# Patient Record
Sex: Female | Born: 1970
Health system: Southern US, Community
[De-identification: ages and names within clinical notes are randomized; demographics above are authoritative.]

## PROBLEM LIST (undated history)

## (undated) DIAGNOSIS — E785 Hyperlipidemia, unspecified: Secondary | ICD-10-CM

## (undated) DIAGNOSIS — G4700 Insomnia, unspecified: Secondary | ICD-10-CM

## (undated) DIAGNOSIS — T753XXA Motion sickness, initial encounter: Secondary | ICD-10-CM

## (undated) DIAGNOSIS — Z8619 Personal history of other infectious and parasitic diseases: Secondary | ICD-10-CM

## (undated) DIAGNOSIS — M1990 Unspecified osteoarthritis, unspecified site: Secondary | ICD-10-CM

## (undated) DIAGNOSIS — F419 Anxiety disorder, unspecified: Secondary | ICD-10-CM

## (undated) DIAGNOSIS — K219 Gastro-esophageal reflux disease without esophagitis: Secondary | ICD-10-CM

## (undated) HISTORY — DX: Insomnia, unspecified: G47.00

## (undated) HISTORY — DX: Hyperlipidemia, unspecified: E78.5

## (undated) HISTORY — DX: Anxiety disorder, unspecified: F41.9

## (undated) HISTORY — DX: Personal history of other infectious and parasitic diseases: Z86.19

---

## 1991-09-11 HISTORY — PX: TUBAL LIGATION: SHX77

## 2011-05-31 LAB — HM PAP SMEAR: HM Pap smear: NEGATIVE

## 2015-04-01 ENCOUNTER — Ambulatory Visit: Payer: Self-pay | Admitting: Family Medicine

## 2015-04-06 ENCOUNTER — Ambulatory Visit: Payer: Self-pay | Admitting: Family Medicine

## 2015-04-07 DIAGNOSIS — G4700 Insomnia, unspecified: Secondary | ICD-10-CM | POA: Insufficient documentation

## 2015-04-07 DIAGNOSIS — F419 Anxiety disorder, unspecified: Secondary | ICD-10-CM | POA: Insufficient documentation

## 2015-04-07 DIAGNOSIS — E7800 Pure hypercholesterolemia, unspecified: Secondary | ICD-10-CM | POA: Insufficient documentation

## 2015-04-08 ENCOUNTER — Ambulatory Visit (INDEPENDENT_AMBULATORY_CARE_PROVIDER_SITE_OTHER): Payer: Commercial Managed Care - HMO | Admitting: Family Medicine

## 2015-04-08 ENCOUNTER — Encounter: Payer: Self-pay | Admitting: Family Medicine

## 2015-04-08 VITALS — BP 110/70 | HR 80 | Temp 98.4°F | Resp 16 | Ht 62.75 in | Wt 151.0 lb

## 2015-04-08 DIAGNOSIS — R351 Nocturia: Secondary | ICD-10-CM | POA: Diagnosis not present

## 2015-04-08 DIAGNOSIS — Z0000 Encounter for general adult medical examination without abnormal findings: Secondary | ICD-10-CM

## 2015-04-08 LAB — POCT URINALYSIS DIPSTICK
BILIRUBIN UA: NEGATIVE
Glucose, UA: NEGATIVE
Ketones, UA: NEGATIVE
LEUKOCYTES UA: NEGATIVE
Nitrite, UA: NEGATIVE
PROTEIN UA: NEGATIVE
Spec Grav, UA: 1.01
Urobilinogen, UA: 0.2
pH, UA: 7

## 2015-04-08 NOTE — Progress Notes (Signed)
Patient: Suzanne Sanchez, Female    DOB: [birth year], 44 y.o.   MRN: [id number] Visit Date: 04/08/2015  Today's Provider: Lelon Huh, MD   Chief Complaint  Patient presents with  . Establish Care  . Annual Exam  . Hyperlipidemia   Subjective:    Annual physical exam Suzanne Sanchez is a 44 y.o. female who presents today for health maintenance and complete physical. She feels fairly well. She reports exercising  wakling 1 hour four times a week. She reports she is sleeping fairly well.  -----------------------------------------------------------------  Lipid/Cholesterol, Follow-up:    Risk factors for vascular disease include none  She reports good compliance with treatment. She is not having side effects.  Current symptoms include polyuria and have been stable. Weight trend: stable Prior visit with dietician: no Current diet: in general, a "healthy" diet   Current exercise: walking Current treatment includes Atorvatstain 20mg  daily.  Wt Readings from Last 3 Encounters:  04/08/15 151 lb (68.493 kg)  05/31/11 146 lb (66.225 kg)    -------------------------------------------------------------------   Review of Systems  Endocrine: Positive for polyuria.  Musculoskeletal: Positive for back pain and arthralgias.  All other systems reviewed and are negative.   Social History She  reports that she has never smoked. She does not have any smokeless tobacco history on file. She reports that she does not drink alcohol or use illicit drugs.  Patient Active Problem List   Diagnosis Date Noted  . Insomnia 04/07/2015  . Pure hypercholesterolemia 04/07/2015  . Anxiety disorder 04/07/2015    Past Surgical History  Procedure Laterality Date  . Tubal ligation  1993    Family History Her family history is not on file. She was adopted.    No Known Allergies  Previous Medications   ATORVASTATIN (LIPITOR) 20 MG TABLET    Take 20 mg by mouth daily.     Patient Care Team: Birdie Sons, MD as PCP - General (Family Medicine)     Objective:   Vitals: BP 110/70 mmHg  Pulse 80  Temp(Src) 98.4 F (36.9 C) (Oral)  Resp 16  Ht 5' 2.75" (1.594 m)  Wt 151 lb (68.493 kg)  BMI 26.96 kg/m2  SpO2 98%   Physical Exam   General Appearance:    Alert, cooperative, no distress, appears stated age  Head:    Normocephalic, without obvious abnormality, atraumatic  Eyes:    PERRL, conjunctiva/corneas clear, EOM's intact, fundi    benign, both eyes  Ears:    Normal TM's and external ear canals, both ears  Nose:   Nares normal, septum midline, mucosa normal, no drainage    or sinus tenderness  Throat:   Lips, mucosa, and tongue normal; teeth and gums normal  Neck:   Supple, symmetrical, trachea midline, no adenopathy;    thyroid:  no enlargement/tenderness/nodules; no carotid   bruit or JVD  Back:     Symmetric, no curvature, ROM normal, no CVA tenderness  Lungs:     Clear to auscultation bilaterally, respirations unlabored  Chest Wall:    No tenderness or deformity   Heart:    Regular rate and rhythm, S1 and S2 normal, no murmur, rub   or gallop  Breast Exam:    deferred  Abdomen:     Benign  Pevic:    deferred  Extremities:   Extremities normal, atraumatic, no cyanosis or edema  Pulses:   2+ and symmetric all extremities  Skin:   Skin color, texture, turgor normal,  no rashes or lesions  Lymph nodes:   No apparent LAD  Neurologic:   No gross deficits.     Depression Screen PHQ 2/9 Scores 04/08/2015  PHQ - 2 Score 0  PHQ- 9 Score 1      Assessment & Plan:     Routine Health Maintenance and Physical Exam  Exercise Activities and Dietary recommendations Goals    None      Immunization History  Administered Date(s) Administered  . Tdap 09/26/2006    Health Maintenance  Topic Date Due  . HIV Screening  07/19/1985  . PAP SMEAR  05/30/2014  . INFLUENZA VACCINE  04/11/2015  . TETANUS/TDAP  09/26/2016      1.  Annual physical exam  - Lipid panel - TSH - Comprehensive metabolic panel - She prefers to see Gyn for routine pap/pelvi/breast exam and requests referral today   2. Nocturia Likely fluid redistribution. Try to get most of fluid intake earlier in day.  - POCT urinalysis dipstick    --------------------------------------------------------------------

## 2015-04-09 LAB — COMPREHENSIVE METABOLIC PANEL
A/G RATIO: 1.8 (ref 1.1–2.5)
ALT: 14 IU/L (ref 0–32)
AST: 17 IU/L (ref 0–40)
Albumin: 4.2 g/dL (ref 3.5–5.5)
Alkaline Phosphatase: 63 IU/L (ref 39–117)
BUN/Creatinine Ratio: 8 — ABNORMAL LOW (ref 9–23)
BUN: 6 mg/dL (ref 6–24)
Bilirubin Total: 0.5 mg/dL (ref 0.0–1.2)
CALCIUM: 9.4 mg/dL (ref 8.7–10.2)
CO2: 25 mmol/L (ref 18–29)
Chloride: 101 mmol/L (ref 97–108)
Creatinine, Ser: 0.74 mg/dL (ref 0.57–1.00)
GFR calc Af Amer: 114 mL/min/{1.73_m2} (ref >59)
GFR, EST NON AFRICAN AMERICAN: 99 mL/min/{1.73_m2} (ref >59)
GLOBULIN, TOTAL: 2.3 g/dL (ref 1.5–4.5)
GLUCOSE: 96 mg/dL (ref 65–99)
Potassium: 4.2 mmol/L (ref 3.5–5.2)
Sodium: 140 mmol/L (ref 134–144)
TOTAL PROTEIN: 6.5 g/dL (ref 6.0–8.5)

## 2015-04-09 LAB — LIPID PANEL
CHOL/HDL RATIO: 2.3 ratio (ref 0.0–4.4)
Cholesterol, Total: 126 mg/dL (ref 100–199)
HDL: 56 mg/dL (ref >39)
LDL CALC: 54 mg/dL (ref 0–99)
Triglycerides: 80 mg/dL (ref 0–149)
VLDL Cholesterol Cal: 16 mg/dL (ref 5–40)

## 2015-04-09 LAB — TSH: TSH: 0.948 u[IU]/mL (ref 0.450–4.500)

## 2015-04-19 ENCOUNTER — Telehealth: Payer: Self-pay | Admitting: Family Medicine

## 2015-04-19 NOTE — Telephone Encounter (Signed)
atorvastatin (LIPITOR) 20 MG tablet  [facility] Suzanne Sanchez

## 2015-05-03 ENCOUNTER — Telehealth: Payer: Self-pay | Admitting: Family Medicine

## 2015-05-03 NOTE — Telephone Encounter (Signed)
[facility] called to tell us that Ms. Trinkle no showed for her appt. today, 05/03/15

## 2015-06-27 ENCOUNTER — Other Ambulatory Visit: Payer: Self-pay | Admitting: Family Medicine

## 2015-06-27 MED ORDER — ATORVASTATIN CALCIUM 20 MG PO TABS: 20.0000 mg | ORAL_TABLET | Freq: Every day | ORAL | Status: DC

## 2015-06-27 NOTE — Telephone Encounter (Signed)
Pt contacted office for refill request on the following medications:  atorvastatin (LIPITOR) 20 MG.  [facility].  [phone]

## 2016-02-21 ENCOUNTER — Ambulatory Visit (INDEPENDENT_AMBULATORY_CARE_PROVIDER_SITE_OTHER): Payer: Commercial Managed Care - HMO | Admitting: Family Medicine

## 2016-02-21 ENCOUNTER — Encounter: Payer: Self-pay | Admitting: Family Medicine

## 2016-02-21 VITALS — BP 100/60 | HR 78 | Temp 98.4°F | Resp 16 | Ht 62.75 in | Wt 150.0 lb

## 2016-02-21 DIAGNOSIS — I868 Varicose veins of other specified sites: Secondary | ICD-10-CM

## 2016-02-21 DIAGNOSIS — I8390 Asymptomatic varicose veins of unspecified lower extremity: Secondary | ICD-10-CM

## 2016-02-21 DIAGNOSIS — L723 Sebaceous cyst: Secondary | ICD-10-CM | POA: Diagnosis not present

## 2016-02-21 NOTE — Progress Notes (Signed)
       Patient: Suzanne Sanchez Female    DOB: [birth year]   45 y.o.   MRN: [id number] Visit Date: 02/21/2016  Today's Provider: Lelon Huh, MD   Chief Complaint  Patient presents with  . Cyst   Subjective:    HPI  Patient has had cyst on upper back left shoulder area for a few years. Started as a blackhead and has grown into a lump which occasionally drains purulent material. It is not sore or painful now, but is bothersome and she would like to have it removed.   Patient also has a swollen vein on her left knee that has grown in the last month.   No Known Allergies No outpatient prescriptions have been marked as taking for the 02/21/16 encounter (Office Visit) with Birdie Sons, MD.    Review of Systems  Constitutional: Negative for fever, chills, appetite change and fatigue.  Respiratory: Negative for chest tightness and shortness of breath.   Cardiovascular: Negative for chest pain and palpitations.  Gastrointestinal: Negative for nausea, vomiting and abdominal pain.  Neurological: Negative for dizziness and weakness.    Social History  Substance Use Topics  . Smoking status: Never Smoker   . Smokeless tobacco: Not on file  . Alcohol Use: No   Objective:   BP 100/60 mmHg  Pulse 78  Temp(Src) 98.4 F (36.9 C) (Oral)  Resp 16  Ht 5' 2.75" (1.594 m)  Wt 150 lb (68.04 kg)  BMI 26.78 kg/m2  SpO2 98%  Physical Exam  General appearance: alert, well developed, well nourished, cooperative and in no distress Head: Normocephalic, without obvious abnormality, atraumatic Respiratory: Respirations even and unlabored, normal respiratory rate Extremities: No gross deformities Skin: About 4 cm non-tender sebaceous cyst upper back. No discharge. Small mass ante-lateral aspect of left knee with BB sized mass, appears to overlie varicose vein.      Assessment & Plan:      1. Sebaceous cyst back Counseled on benign character of lesion, but she is very bothered by it  and wants it excised.  - Ambulatory referral to General Surgery  2. Varicose vein knee (suspected) - Have surgery take a look at it, consider vascular referral       Lelon Huh, MD  [place]

## 2016-02-28 ENCOUNTER — Encounter: Payer: Self-pay | Admitting: *Deleted

## 2016-03-05 ENCOUNTER — Encounter: Payer: Self-pay | Admitting: General Surgery

## 2016-03-05 ENCOUNTER — Ambulatory Visit (INDEPENDENT_AMBULATORY_CARE_PROVIDER_SITE_OTHER): Payer: 59 | Admitting: General Surgery

## 2016-03-05 VITALS — BP 106/58 | HR 68 | Resp 12 | Ht 62.0 in | Wt 152.0 lb

## 2016-03-05 DIAGNOSIS — L723 Sebaceous cyst: Secondary | ICD-10-CM | POA: Diagnosis not present

## 2016-03-05 NOTE — Progress Notes (Signed)
Patient ID: Suzanne Sanchez, female   DOB: 1971/06/19, 45 y.o.   MRN: [id number]  Chief Complaint  Patient presents with  . Cyst    HPI Suzanne Sanchez is a 45 y.o. female.  Here today for evaluation of an upper back cyst. She states she had it drained by her husband back in December 2016. She states it started out as a black head. She states the area started swelling again last month. Denies any pain. 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  She is her today with her daughter, Suzanne Sanchez.  I personally reviewed the patient's history.   HPI  Past Medical History  Diagnosis Date  . History of chicken pox   . Insomnia   . Hyperlipidemia   . Anxiety     Past Surgical History  Procedure Laterality Date  . Tubal ligation  1993    Family History  Problem Relation Age of Onset  . Adopted: Yes    Social History Social History  Substance Use Topics  . Smoking status: Never Smoker   . Smokeless tobacco: Never Used  . Alcohol Use: No    No Known Allergies  No current outpatient prescriptions on file.   No current facility-administered medications for this visit.    Review of Systems Review of Systems  Constitutional: Negative.   Respiratory: Negative.   Cardiovascular: Negative.     Blood pressure 106/58, pulse 68, resp. rate 12, height 5\' 2"  (1.575 m), weight 152 lb (68.947 kg), last menstrual period 03/04/2016.  Physical Exam Physical Exam  Constitutional: She is oriented to person, place, and time. She appears well-developed and well-nourished.  Neck:    Neurological: She is alert and oriented to person, place, and time.  Skin: Skin is warm and dry.  Psychiatric: Her behavior is normal.       Assessment    Symptomatic sebaceous cyst, presently noninfected.    Plan    Indication for surgical excision were reviewed. The patient was amenable to proceed. The area was cleansed with ChloraPrep and 20 mL of 0.5% Xylocaine with 0.25%  Marcaine with 1 200,000 units epinephrine was utilized well tolerated. The area was reprepped with ChloraPrep. Through elliptical incision the intact cyst sac was excised. This was sent for routine histology. The wound was approximated with 3-0 Vicryl sutures to the adipose layer and interrupted 4-0 Prolene sutures to the skin. Telfa and Tegaderm dressing applied. Ice pack provided. Wound care reviewed.  The patient's daughter is a Marine scientist. She may remove the sutures and 9-10 days if desired or the patient may return here for formal assessment. If her daughter removes the sutures she has been asked to call give a description of the wound.    PCP:  Lelon Huh This information has been scribed by Karie Fetch RN, BSN,BC.    Robert Bellow 03/06/2016, 4:29 PM

## 2016-03-05 NOTE — Patient Instructions (Addendum)
The patient is aware to call back for any questions or concerns. keep area clean May shower with dressing in place May remove dressing in 2-3 days and use band aid to cover the area

## 2016-03-06 DIAGNOSIS — L723 Sebaceous cyst: Secondary | ICD-10-CM | POA: Insufficient documentation

## 2016-03-08 ENCOUNTER — Telehealth: Payer: Self-pay

## 2016-03-08 NOTE — Telephone Encounter (Signed)
-----   Message from Robert Bellow, MD sent at 03/07/2016  9:39 PM EDT ----- Please notify patient pathology was fine.  See if she is having any problems.  May return for suture removal or daughter may do for her.   ----- Message -----    From: [facility]    Sent: 03/07/2016   6:47 PM      To: Robert Bellow, MD

## 2016-03-08 NOTE — Telephone Encounter (Signed)
Notified patient as instructed, patient pleased. Discussed follow-up appointment, patient states that her daughter will take out her sutures. She reports that she is doing very well.

## 2016-03-14 ENCOUNTER — Ambulatory Visit: Payer: 59

## 2016-04-25 ENCOUNTER — Encounter: Payer: Commercial Managed Care - HMO | Admitting: Physician Assistant

## 2016-07-31 ENCOUNTER — Encounter: Payer: Self-pay | Admitting: Physician Assistant

## 2016-07-31 ENCOUNTER — Ambulatory Visit (INDEPENDENT_AMBULATORY_CARE_PROVIDER_SITE_OTHER): Payer: 59 | Admitting: Physician Assistant

## 2016-07-31 VITALS — BP 110/62 | HR 78 | Temp 98.0°F | Resp 16 | Ht 63.0 in | Wt 152.0 lb

## 2016-07-31 DIAGNOSIS — Z0000 Encounter for general adult medical examination without abnormal findings: Secondary | ICD-10-CM | POA: Diagnosis not present

## 2016-07-31 DIAGNOSIS — Z124 Encounter for screening for malignant neoplasm of cervix: Secondary | ICD-10-CM | POA: Diagnosis not present

## 2016-07-31 LAB — POCT URINALYSIS DIPSTICK
Bilirubin, UA: NEGATIVE
Blood, UA: NEGATIVE
Glucose, UA: NEGATIVE
Ketones, UA: NEGATIVE
Leukocytes, UA: NEGATIVE
Nitrite, UA: NEGATIVE
Protein, UA: NEGATIVE
Spec Grav, UA: 1.01
Urobilinogen, UA: 0.2
pH, UA: 8.5

## 2016-07-31 NOTE — Patient Instructions (Signed)
Health Maintenance, Female Introduction Adopting a healthy lifestyle and getting preventive care can go a long way to promote health and wellness. Talk with your health care provider about what schedule of regular examinations is right for you. This is a good chance for you to check in with your provider about disease prevention and staying healthy. In between checkups, there are plenty of things you can do on your own. Experts have done a lot of research about which lifestyle changes and preventive measures are most likely to keep you healthy. Ask your health care provider for more information. Weight and diet Eat a healthy diet  Be sure to include plenty of vegetables, fruits, low-fat dairy products, and lean protein.  Do not eat a lot of foods high in solid fats, added sugars, or salt.  Get regular exercise. This is one of the most important things you can do for your health.  Most adults should exercise for at least 150 minutes each week. The exercise should increase your heart rate and make you sweat (moderate-intensity exercise).  Most adults should also do strengthening exercises at least twice a week. This is in addition to the moderate-intensity exercise. Maintain a healthy weight  Body mass index (BMI) is a measurement that can be used to identify possible weight problems. It estimates body fat based on height and weight. Your health care provider can help determine your BMI and help you achieve or maintain a healthy weight.  For females 46 years of age and older: age and older:  A BMI below 18.5 is considered underweight.  A BMI of 18.5 to 24.9 is normal.  A BMI of 25 to 29.9 is considered overweight.  A BMI of 30 and above is considered obese. Watch levels of cholesterol and blood lipids  You should start having your blood tested for lipids and cholesterol at 46 years of age, then have this test every 5 years., then have this test every 5 years.  You may need to have your cholesterol levels checked more often if:  Your  lipid or cholesterol levels are high.  You are older than 46 years of age.  You are at high risk for heart disease. Cancer screening Lung Cancer  Lung cancer screening is recommended for adults 46-22 years old who are at high risk for lung cancer because of a history of smoking. who are at high risk for lung cancer because of a history of smoking.  A yearly low-dose CT scan of the lungs is recommended for people who:  Currently smoke.  Have quit within the past 15 years.  Have at least a 30-pack-year history of smoking. A pack year is smoking an average of one pack of cigarettes a day for 1 year.  Yearly screening should continue until it has been 15 years since you quit.  Yearly screening should stop if you develop a health problem that would prevent you from having lung cancer treatment. Breast Cancer  Practice breast self-awareness. This means understanding how your breasts normally appear and feel.  It also means doing regular breast self-exams. Let your health care provider know about any changes, no matter how small.  If you are in your 40s or 30s, you should have a clinical breast exam (CBE) by a health care provider every 1-3 years as part of a regular health exam.  If you are 46 or older, have a CBE every year. Also consider having a breast X-ray (mammogram) every year.  If you have a family history of breast cancer, talk to your health care provider about genetic screening.  If you are at high risk for breast cancer,  talk to your health care provider about having an MRI and a mammogram every year.  Breast cancer gene (BRCA) assessment is recommended for women who have family members with BRCA-related cancers. BRCA-related cancers include:  Breast.  Ovarian.  Tubal.  Peritoneal cancers.  Results of the assessment will determine the need for genetic counseling and BRCA1 and BRCA2 testing. Cervical Cancer  Your health care provider may recommend that you be screened regularly for cancer of the pelvic organs (ovaries, uterus, and  vagina). This screening involves a pelvic examination, including checking for microscopic changes to the surface of your cervix (Pap test). You may be encouraged to have this screening done every 3 years, beginning at age 21.  For women ages 30-65, health care providers may recommend pelvic exams and Pap testing every 3 years, or they may recommend the Pap and pelvic exam, combined with testing for human papilloma virus (HPV), every 5 years. Some types of HPV increase your risk of cervical cancer. Testing for HPV may also be done on women of any age with unclear Pap test results.  Other health care providers may not recommend any screening for nonpregnant women who are considered low risk for pelvic cancer and who do not have symptoms. Ask your health care provider if a screening pelvic exam is right for you.  If you have had past treatment for cervical cancer or a condition that could lead to cancer, you need Pap tests and screening for cancer for at least 20 years after your treatment. If Pap tests have been discontinued, your risk factors (such as having a new sexual partner) need to be reassessed to determine if screening should resume. Some women have medical problems that increase the chance of getting cervical cancer. In these cases, your health care provider may recommend more frequent screening and Pap tests. Colorectal Cancer  This type of cancer can be detected and often prevented.  Routine colorectal cancer screening usually begins at 46 years of age and continues through 46 years of age. and continues through 46 years of age.  Your health care provider may recommend screening at an earlier age if you have risk factors for colon cancer.  Your health care provider may also recommend using home test kits to check for hidden blood in the stool.  A small camera at the end of a tube can be used to examine your colon directly (sigmoidoscopy or colonoscopy). This is done to check for the earliest forms of colorectal  cancer.  Routine screening usually begins at age 46.  Direct examination of the colon should be repeated every 5-10 years through 46 years of age. However, you may need to be screened more often if early forms of precancerous polyps or small growths are found. Skin Cancer  Check your skin from head to toe regularly.  Tell your health care provider about any new moles or changes in moles, especially if there is a change in a mole's shape or color.  Also tell your health care provider if you have a mole that is larger than the size of a pencil eraser.  Always use sunscreen. Apply sunscreen liberally and repeatedly throughout the day.  Protect yourself by wearing long sleeves, pants, a wide-brimmed hat, and sunglasses whenever you are outside. Heart disease, diabetes, and high blood pressure  High blood pressure causes heart disease and increases the risk of stroke. High blood pressure is more likely to develop in:  People who have blood pressure in the high end of the normal range (130-139/85-89 mm Hg).    People who are overweight or obese.  People who are African American.  If you are 55-88 years of age, have your blood pressure checked every 3-5 years. If you are 10 years of age or older, have your blood pressure checked every year. You should have your blood pressure measured twice--once when you are at a hospital or clinic, and once when you are not at a hospital or clinic. Record the average of the two measurements. To check your blood pressure when you are not at a hospital or clinic, you can use:  An automated blood pressure machine at a pharmacy.  A home blood pressure monitor.  If you are between 29 years and 58 years old, ask your health care provider if you should take aspirin to prevent strokes.  Have regular diabetes screenings. This involves taking a blood sample to check your fasting blood sugar level.  If you are at a normal weight and have a low risk for diabetes,  have this test once every three years after 46 years of age.  If you are overweight and have a high risk for diabetes, consider being tested at a younger age or more often. Preventing infection Hepatitis B  If you have a higher risk for hepatitis B, you should be screened for this virus. You are considered at high risk for hepatitis B if:  You were born in a country where hepatitis B is common. Ask your health care provider which countries are considered high risk.  Your parents were born in a high-risk country, and you have not been immunized against hepatitis B (hepatitis B vaccine).  You have HIV or AIDS.  You use needles to inject street drugs.  You live with someone who has hepatitis B.  You have had sex with someone who has hepatitis B.  You get hemodialysis treatment.  You take certain medicines for conditions, including cancer, organ transplantation, and autoimmune conditions. Hepatitis C  Blood testing is recommended for:  Everyone born from [birth year] through 1966.  Anyone with known risk factors for hepatitis C. Sexually transmitted infections (STIs)  You should be screened for sexually transmitted infections (STIs) including gonorrhea and chlamydia if:  You are sexually active and are younger than 46 years of age.  You are older than 46 years of age and your health care provider tells you that you are at risk for this type of infection.  Your sexual activity has changed since you were last screened and you are at an increased risk for chlamydia or gonorrhea. Ask your health care provider if you are at risk.  If you do not have HIV, but are at risk, it may be recommended that you take a prescription medicine daily to prevent HIV infection. This is called pre-exposure prophylaxis (PrEP). You are considered at risk if:  You are sexually active and do not regularly use condoms or know the HIV status of your partner(s).  You take drugs by injection.  You are sexually  active with a partner who has HIV. Talk with your health care provider about whether you are at high risk of being infected with HIV. If you choose to begin PrEP, you should first be tested for HIV. You should then be tested every 3 months for as long as you are taking PrEP. Pregnancy  If you are premenopausal and you may become pregnant, ask your health care provider about preconception counseling.  If you may become pregnant, take 400 to 800 micrograms (mcg) of folic acid  every day.  If you want to prevent pregnancy, talk to your health care provider about birth control (contraception). Osteoporosis and menopause  Osteoporosis is a disease in which the bones lose minerals and strength with aging. This can result in serious bone fractures. Your risk for osteoporosis can be identified using a bone density scan.  If you are 75 years of age or older, or if you are at risk for osteoporosis and fractures, ask your health care provider if you should be screened.  Ask your health care provider whether you should take a calcium or vitamin D supplement to lower your risk for osteoporosis.  Menopause may have certain physical symptoms and risks.  Hormone replacement therapy may reduce some of these symptoms and risks. Talk to your health care provider about whether hormone replacement therapy is right for you. Follow these instructions at home:  Schedule regular health, dental, and eye exams.  Stay current with your immunizations.  Do not use any tobacco products including cigarettes, chewing tobacco, or electronic cigarettes.  If you are pregnant, do not drink alcohol.  If you are breastfeeding, limit how much and how often you drink alcohol.  Limit alcohol intake to no more than 1 drink per day for nonpregnant women. One drink equals 12 ounces of beer, 5 ounces of wine, or 1 ounces of hard liquor.  Do not use street drugs.  Do not share needles.  Ask your health care provider for  help if you need support or information about quitting drugs.  Tell your health care provider if you often feel depressed.  Tell your health care provider if you have ever been abused or do not feel safe at home. This information is not intended to replace advice given to you by your health care provider. Make sure you discuss any questions you have with your health care provider. Document Released: 03/12/2011 Document Revised: 02/02/2016 Document Reviewed: 05/31/2015  2017 [employer]

## 2016-07-31 NOTE — Progress Notes (Signed)
Patient: Suzanne Sanchez, Female    DOB: 02-09-1971, 45 y.o.   MRN: [id number] Visit Date: 07/31/2016  Today's Provider: Trinna Post, PA-C   Chief Complaint  Patient presents with  . Annual Exam   Subjective:    Annual physical exam Suzanne Sanchez is a 45 y.o. female who presents today for health maintenance and complete physical. She feels well. She reports exercising about 3 times a week. She reports she is sleeping poorly, but this is nothing new for her. 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  She has been married to her husband for 28 years. She has two grown children.  She reports having regular periods. She does notice some low back pain before her period and an odor after her period, and wonders if there's anything she can do about it. She does not have concern for STDs. She was last cotested in 2012 and was negative for both cytology and HPV. Her last mammogram was in 2012 and was normal.   She does not have family history of colon or breast cancer.   She does not smoke or drink alcohol. She has never smoked, no smokeless tobacco.  -----------------------------------------------------------------  Pap- 05/31/11 Negative, Negative HPV Mammogram- 05/31/11 normal  Immunization History  Administered Date(s) Administered  . Tdap 09/26/2006     Review of Systems  Constitutional: Negative.   HENT: Negative.   Eyes: Negative.   Respiratory: Negative.   Cardiovascular: Negative.   Gastrointestinal: Negative.   Endocrine: Negative.   Genitourinary: Positive for frequency.  Musculoskeletal: Positive for back pain.  Skin: Negative.   Allergic/Immunologic: Negative.   Neurological: Negative.   Hematological: Negative.   Psychiatric/Behavioral: Positive for sleep disturbance.    Social History      She  reports that she has never smoked. She has never used smokeless tobacco. She reports that she  does not drink alcohol or use drugs.       Social History   Social History  . Marital status: Married    Spouse name: N/A  . Number of children: 2  . Years of education: college   Occupational History  . 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 Smoking status: Never Smoker  . Smokeless tobacco: Never Used  . Alcohol use No  . Drug use: No  . Sexual activity: Not Asked   Other Topics Concern  . None   Social History Narrative  . None    Past Medical History:  Diagnosis Date  . Anxiety   . History of chicken pox   . Hyperlipidemia   . Insomnia      Patient Active Problem List   Diagnosis Date Noted  . Sebaceous cyst 03/06/2016  . Pure hypercholesterolemia 04/07/2015    Past Surgical History:  Procedure Laterality Date  . TUBAL LIGATION  1993    Family History        No family status information on file.        Her family history is not on file. She was adopted.     No Known Allergies  No current outpatient prescriptions on file.   Patient Care Team: Birdie Sons, MD as PCP - General (Family Medicine) Birdie Sons, MD as Referring Physician (Family Medicine) Robert Bellow, MD (General Surgery)      Objective:   Vitals: BP  110/62 (BP Location: Left Arm, Patient Position: Sitting, Cuff Size: Normal)   Pulse 78   Temp 98 F (36.7 C) (Oral)   Resp 16   Ht '5\' 3"'$  (1.6 m)   Wt 152 lb (68.9 kg)   BMI 26.93 kg/m    Physical Exam  Constitutional: She is oriented to person, place, and time. Vital signs are normal. She appears well-developed and well-nourished.  Non-toxic appearance. She does not have a sickly appearance. She does not appear ill. No distress.  HENT:  Head: Normocephalic.  Right Ear: External ear and ear canal normal.  Left Ear: External ear and ear canal normal.  Nose: Nose normal.  Mouth/Throat: Oropharynx is clear and moist. No oropharyngeal exudate.    Eyes: Pupils are equal, round, and reactive to light.  Neck: Normal range of motion and full passive range of motion without pain. Neck supple. Carotid bruit is not present. No neck rigidity. No Brudzinski's sign and no Kernig's sign noted.  Cardiovascular: Normal rate and regular rhythm.   Pulmonary/Chest: Effort normal and breath sounds normal. No respiratory distress. Right breast exhibits no inverted nipple, no mass, no nipple discharge, no skin change and no tenderness. Left breast exhibits no inverted nipple, no mass, no nipple discharge, no skin change and no tenderness. Breasts are symmetrical.  Abdominal: Soft. Bowel sounds are normal.  Genitourinary: No breast swelling, tenderness, discharge or bleeding. Pelvic exam was performed with patient supine. No labial fusion. There is no rash, tenderness, lesion or injury on the right labia. There is no rash, tenderness, lesion or injury on the left labia. Uterus is not deviated, not enlarged, not fixed and not tender. Cervix exhibits no motion tenderness, no discharge and no friability. Right adnexum displays no mass, no tenderness and no fullness. Left adnexum displays no mass, no tenderness and no fullness. No erythema, tenderness or bleeding in the vagina. No foreign body in the vagina. No signs of injury around the vagina. No vaginal discharge found.  Musculoskeletal: Normal range of motion. She exhibits no tenderness.  Neurological: She is alert and oriented to person, place, and time. She has normal strength. She is not disoriented. No sensory deficit. She displays a negative Romberg sign. Gait normal.  Skin: Skin is warm and dry. She is not diaphoretic.  Psychiatric: She has a normal mood and affect. Her behavior is normal. Judgment and thought content normal.     Depression Screen PHQ 2/9 Scores 04/08/2015  PHQ - 2 Score 0  PHQ- 9 Score 1      Assessment & Plan:     Routine Health Maintenance and Physical Exam  Exercise  Activities and Dietary recommendations Goals    None      Immunization History  Administered Date(s) Administered  . Tdap 09/26/2006    Health Maintenance  Topic Date Due  . HIV Screening  07/19/1985  . PAP SMEAR  05/30/2014  . INFLUENZA VACCINE  04/10/2016  . TETANUS/TDAP  09/26/2016     Discussed health benefits of physical activity, and encouraged her to engage in regular exercise appropriate for her age and condition.    Problem List Items Addressed This Visit    None    Visit Diagnoses    Annual physical exam    -  Primary   Relevant Orders   MM Digital Screening   Comprehensive metabolic panel   CBC with Differential/Platelet   TSH   Lipid panel   POCT urinalysis dipstick (Completed)   Cervical cancer screening  Relevant Orders   Pap IG and HPV (high risk) DNA detection     Evaluated and treated as above. Ordered annual labs, put in for mammogram screening. If cotesting normal, patient does not need another PAP for five years. Urinalysis to assess urinary frequency was negative. Encouraged patient to keep up healthy diet and lifestyle.   Return in about 1 year (around 07/31/2017) for CPE.  Patient Instructions  Health Maintenance, Female Introduction Adopting a healthy lifestyle and getting preventive care can go a long way to promote health and wellness. Talk with your health care provider about what schedule of regular examinations is right for you. This is a good chance for you to check in with your provider about disease prevention and staying healthy. In between checkups, there are plenty of things you can do on your own. Experts have done a lot of research about which lifestyle changes and preventive measures are most likely to keep you healthy. Ask your health care provider for more information. Weight and diet Eat a healthy diet  Be sure to include plenty of vegetables, fruits, low-fat dairy products, and lean protein.  Do not eat a lot of foods  high in solid fats, added sugars, or salt.  Get regular exercise. This is one of the most important things you can do for your health.  Most adults should exercise for at least 150 minutes each week. The exercise should increase your heart rate and make you sweat (moderate-intensity exercise).  Most adults should also do strengthening exercises at least twice a week. This is in addition to the moderate-intensity exercise. Maintain a healthy weight  Body mass index (BMI) is a measurement that can be used to identify possible weight problems. It estimates body fat based on height and weight. Your health care provider can help determine your BMI and help you achieve or maintain a healthy weight.  For females 17 years of age and older:  A BMI below 18.5 is considered underweight.  A BMI of 18.5 to 24.9 is normal.  A BMI of 25 to 29.9 is considered overweight.  A BMI of 30 and above is considered obese. Watch levels of cholesterol and blood lipids  You should start having your blood tested for lipids and cholesterol at 46 years of age, then have this test every 5 years.  You may need to have your cholesterol levels checked more often if:  Your lipid or cholesterol levels are high.  You are older than 46 years of age.  You are at high risk for heart disease. Cancer screening Lung Cancer  Lung cancer screening is recommended for adults 62-81 years old who are at high risk for lung cancer because of a history of smoking.  A yearly low-dose CT scan of the lungs is recommended for people who:  Currently smoke.  Have quit within the past 15 years.  Have at least a 30-pack-year history of smoking. A pack year is smoking an average of one pack of cigarettes a day for 1 year.  Yearly screening should continue until it has been 15 years since you quit.  Yearly screening should stop if you develop a health problem that would prevent you from having lung cancer treatment. Breast  Cancer  Practice breast self-awareness. This means understanding how your breasts normally appear and feel.  It also means doing regular breast self-exams. Let your health care provider know about any changes, no matter how small.  If you are in your 20s or 30s,  you should have a clinical breast exam (CBE) by a health care provider every 1-3 years as part of a regular health exam.  If you are 32 or older, have a CBE every year. Also consider having a breast X-ray (mammogram) every year.  If you have a family history of breast cancer, talk to your health care provider about genetic screening.  If you are at high risk for breast cancer, talk to your health care provider about having an MRI and a mammogram every year.  Breast cancer gene (BRCA) assessment is recommended for women who have family members with BRCA-related cancers. BRCA-related cancers include:  Breast.  Ovarian.  Tubal.  Peritoneal cancers.  Results of the assessment will determine the need for genetic counseling and BRCA1 and BRCA2 testing. Cervical Cancer  Your health care provider may recommend that you be screened regularly for cancer of the pelvic organs (ovaries, uterus, and vagina). This screening involves a pelvic examination, including checking for microscopic changes to the surface of your cervix (Pap test). You may be encouraged to have this screening done every 3 years, beginning at age 71.  For women ages 26-65, health care providers may recommend pelvic exams and Pap testing every 3 years, or they may recommend the Pap and pelvic exam, combined with testing for human papilloma virus (HPV), every 5 years. Some types of HPV increase your risk of cervical cancer. Testing for HPV may also be done on women of any age with unclear Pap test results.  Other health care providers may not recommend any screening for nonpregnant women who are considered low risk for pelvic cancer and who do not have symptoms. Ask your  health care provider if a screening pelvic exam is right for you.  If you have had past treatment for cervical cancer or a condition that could lead to cancer, you need Pap tests and screening for cancer for at least 20 years after your treatment. If Pap tests have been discontinued, your risk factors (such as having a new sexual partner) need to be reassessed to determine if screening should resume. Some women have medical problems that increase the chance of getting cervical cancer. In these cases, your health care provider may recommend more frequent screening and Pap tests. Colorectal Cancer  This type of cancer can be detected and often prevented.  Routine colorectal cancer screening usually begins at 46 years of age and continues through 46 years of age.  Your health care provider may recommend screening at an earlier age if you have risk factors for colon cancer.  Your health care provider may also recommend using home test kits to check for hidden blood in the stool.  A small camera at the end of a tube can be used to examine your colon directly (sigmoidoscopy or colonoscopy). This is done to check for the earliest forms of colorectal cancer.  Routine screening usually begins at age 66.  Direct examination of the colon should be repeated every 5-10 years through 46 years of age. However, you may need to be screened more often if early forms of precancerous polyps or small growths are found. Skin Cancer  Check your skin from head to toe regularly.  Tell your health care provider about any new moles or changes in moles, especially if there is a change in a mole's shape or color.  Also tell your health care provider if you have a mole that is larger than the size of a pencil eraser.  Always use  sunscreen. Apply sunscreen liberally and repeatedly throughout the day.  Protect yourself by wearing long sleeves, pants, a wide-brimmed hat, and sunglasses whenever you are outside. Heart  disease, diabetes, and high blood pressure  High blood pressure causes heart disease and increases the risk of stroke. High blood pressure is more likely to develop in:  People who have blood pressure in the high end of the normal range (130-139/85-89 mm Hg).  People who are overweight or obese.  People who are African American.  If you are 48-43 years of age, have your blood pressure checked every 3-5 years. If you are 69 years of age or older, have your blood pressure checked every year. You should have your blood pressure measured twice-once when you are at a hospital or clinic, and once when you are not at a hospital or clinic. Record the average of the two measurements. To check your blood pressure when you are not at a hospital or clinic, you can use:  An automated blood pressure machine at a pharmacy.  A home blood pressure monitor.  If you are between 16 years and 38 years old, ask your health care provider if you should take aspirin to prevent strokes.  Have regular diabetes screenings. This involves taking a blood sample to check your fasting blood sugar level.  If you are at a normal weight and have a low risk for diabetes, have this test once every three years after 46 years of age.  If you are overweight and have a high risk for diabetes, consider being tested at a younger age or more often. Preventing infection Hepatitis B  If you have a higher risk for hepatitis B, you should be screened for this virus. You are considered at high risk for hepatitis B if:  You were born in a country where hepatitis B is common. Ask your health care provider which countries are considered high risk.  Your parents were born in a high-risk country, and you have not been immunized against hepatitis B (hepatitis B vaccine).  You have HIV or AIDS.  You use needles to inject street drugs.  You live with someone who has hepatitis B.  You have had sex with someone who has hepatitis  B.  You get hemodialysis treatment.  You take certain medicines for conditions, including cancer, organ transplantation, and autoimmune conditions. Hepatitis C  Blood testing is recommended for:  Everyone born from [birth year] through 1966.  Anyone with known risk factors for hepatitis C. Sexually transmitted infections (STIs)  You should be screened for sexually transmitted infections (STIs) including gonorrhea and chlamydia if:  You are sexually active and are younger than 46 years of age.  You are older than 46 years of age and your health care provider tells you that you are at risk for this type of infection.  Your sexual activity has changed since you were last screened and you are at an increased risk for chlamydia or gonorrhea. Ask your health care provider if you are at risk.  If you do not have HIV, but are at risk, it may be recommended that you take a prescription medicine daily to prevent HIV infection. This is called pre-exposure prophylaxis (PrEP). You are considered at risk if:  You are sexually active and do not regularly use condoms or know the HIV status of your partner(s).  You take drugs by injection.  You are sexually active with a partner who has HIV. Talk with your health care provider about whether  you are at high risk of being infected with HIV. If you choose to begin PrEP, you should first be tested for HIV. You should then be tested every 3 months for as long as you are taking PrEP. Pregnancy  If you are premenopausal and you may become pregnant, ask your health care provider about preconception counseling.  If you may become pregnant, take 400 to 800 micrograms (mcg) of folic acid every day.  If you want to prevent pregnancy, talk to your health care provider about birth control (contraception). Osteoporosis and menopause  Osteoporosis is a disease in which the bones lose minerals and strength with aging. This can result in serious bone fractures. Your  risk for osteoporosis can be identified using a bone density scan.  If you are 38 years of age or older, or if you are at risk for osteoporosis and fractures, ask your health care provider if you should be screened.  Ask your health care provider whether you should take a calcium or vitamin D supplement to lower your risk for osteoporosis.  Menopause may have certain physical symptoms and risks.  Hormone replacement therapy may reduce some of these symptoms and risks. Talk to your health care provider about whether hormone replacement therapy is right for you. Follow these instructions at home:  Schedule regular health, dental, and eye exams.  Stay current with your immunizations.  Do not use any tobacco products including cigarettes, chewing tobacco, or electronic cigarettes.  If you are pregnant, do not drink alcohol.  If you are breastfeeding, limit how much and how often you drink alcohol.  Limit alcohol intake to no more than 1 drink per day for nonpregnant women. One drink equals 12 ounces of beer, 5 ounces of wine, or 1 ounces of hard liquor.  Do not use street drugs.  Do not share needles.  Ask your health care provider for help if you need support or information about quitting drugs.  Tell your health care provider if you often feel depressed.  Tell your health care provider if you have ever been abused or do not feel safe at home. This information is not intended to replace advice given to you by your health care provider. Make sure you discuss any questions you have with your health care provider. Document Released: 03/12/2011 Document Revised: 02/02/2016 Document Reviewed: 05/31/2015  2017 Elsevier    The entirety of the information documented in the History of Present Illness, Review of Systems and Physical Exam were personally obtained by me. Portions of this information were initially documented by [place] and reviewed by me for thoroughness and accuracy.       --------------------------------------------------------------------    Trinna Post, PA-C  [facility]

## 2016-08-01 ENCOUNTER — Telehealth: Payer: Self-pay

## 2016-08-01 LAB — CBC WITH DIFFERENTIAL/PLATELET
Basophils Absolute: 0 10*3/uL (ref 0.0–0.2)
Basos: 0 %
EOS (ABSOLUTE): 0.1 10*3/uL (ref 0.0–0.4)
Eos: 2 %
Hematocrit: 40.8 % (ref 34.0–46.6)
Hemoglobin: 13.6 g/dL (ref 11.1–15.9)
Immature Grans (Abs): 0 10*3/uL (ref 0.0–0.1)
Immature Granulocytes: 0 %
Lymphocytes Absolute: 2 10*3/uL (ref 0.7–3.1)
Lymphs: 30 %
MCH: 28.9 pg (ref 26.6–33.0)
MCHC: 33.3 g/dL (ref 31.5–35.7)
MCV: 87 fL (ref 79–97)
Monocytes Absolute: 0.4 10*3/uL (ref 0.1–0.9)
Monocytes: 6 %
Neutrophils Absolute: 4.2 10*3/uL (ref 1.4–7.0)
Neutrophils: 62 %
Platelets: 351 10*3/uL (ref 150–379)
RBC: 4.7 x10E6/uL (ref 3.77–5.28)
RDW: 13.8 % (ref 12.3–15.4)
WBC: 6.9 10*3/uL (ref 3.4–10.8)

## 2016-08-01 LAB — COMPREHENSIVE METABOLIC PANEL
ALT: 13 IU/L (ref 0–32)
AST: 20 IU/L (ref 0–40)
Albumin/Globulin Ratio: 1.8 (ref 1.2–2.2)
Albumin: 4.2 g/dL (ref 3.5–5.5)
Alkaline Phosphatase: 54 IU/L (ref 39–117)
BUN/Creatinine Ratio: 9 (ref 9–23)
BUN: 6 mg/dL (ref 6–24)
Bilirubin Total: 0.5 mg/dL (ref 0.0–1.2)
CO2: 27 mmol/L (ref 18–29)
Calcium: 9.1 mg/dL (ref 8.7–10.2)
Chloride: 101 mmol/L (ref 96–106)
Creatinine, Ser: 0.68 mg/dL (ref 0.57–1.00)
GFR calc Af Amer: 121 mL/min/{1.73_m2} (ref >59)
GFR calc non Af Amer: 105 mL/min/{1.73_m2} (ref >59)
Globulin, Total: 2.3 g/dL (ref 1.5–4.5)
Glucose: 92 mg/dL (ref 65–99)
Potassium: 4.1 mmol/L (ref 3.5–5.2)
Sodium: 140 mmol/L (ref 134–144)
Total Protein: 6.5 g/dL (ref 6.0–8.5)

## 2016-08-01 LAB — TSH: TSH: 1.18 u[IU]/mL (ref 0.450–4.500)

## 2016-08-01 LAB — LIPID PANEL
Chol/HDL Ratio: 3.5 ratio units (ref 0.0–4.4)
Cholesterol, Total: 221 mg/dL — ABNORMAL HIGH (ref 100–199)
HDL: 64 mg/dL (ref >39)
LDL Calculated: 138 mg/dL — ABNORMAL HIGH (ref 0–99)
Triglycerides: 95 mg/dL (ref 0–149)
VLDL Cholesterol Cal: 19 mg/dL (ref 5–40)

## 2016-08-01 NOTE — Telephone Encounter (Signed)
-----   Message from Trinna Post, [place] sent at 08/01/2016  9:58 AM EST ----- Labs stable and normal. Total cholesterol technically high, though HDL (good cholesterol) is normal. Keep up the good work.

## 2016-08-01 NOTE — Telephone Encounter (Signed)
Patient has been advised. KW 

## 2016-08-06 ENCOUNTER — Telehealth: Payer: Self-pay

## 2016-08-06 NOTE — Telephone Encounter (Signed)
-----   Message from Trinna Post, [place] sent at 08/06/2016  1:35 PM EST ----- HPV negative. Awaiting PAP results.

## 2016-08-06 NOTE — Telephone Encounter (Signed)
Patient has been advised. KW 

## 2016-08-06 NOTE — Telephone Encounter (Signed)
Pt returned call.  Thanks Con Memos

## 2016-08-06 NOTE — Telephone Encounter (Signed)
LMTCB 08/06/2016  Thanks,   -Mickel Baas

## 2016-08-07 LAB — PAP IG AND HPV HIGH-RISK
HPV, high-risk: NEGATIVE
PAP Smear Comment: 0

## 2016-10-08 ENCOUNTER — Encounter: Payer: Self-pay | Admitting: Family Medicine

## 2016-10-08 ENCOUNTER — Ambulatory Visit (INDEPENDENT_AMBULATORY_CARE_PROVIDER_SITE_OTHER): Payer: 59 | Admitting: Family Medicine

## 2016-10-08 VITALS — BP 118/68 | HR 86 | Temp 98.8°F | Resp 16 | Wt 149.0 lb

## 2016-10-08 DIAGNOSIS — R202 Paresthesia of skin: Secondary | ICD-10-CM

## 2016-10-08 DIAGNOSIS — R200 Anesthesia of skin: Secondary | ICD-10-CM

## 2016-10-08 DIAGNOSIS — R140 Abdominal distension (gaseous): Secondary | ICD-10-CM

## 2016-10-08 NOTE — Progress Notes (Signed)
       Patient: Suzanne Sanchez Female    DOB: Aug 23, 1971   46 y.o.   MRN: [id number] Visit Date: 10/08/2016  Today's Provider: Lelon Huh, MD   Chief Complaint  Patient presents with  . Tingling   Subjective:    HPI Patient comes in today c/o numbness and tingling off and on for 2 weeks. Patient reports that she has the tingling in both of her arms and hands. She reports that her symptoms are worse when she first gets up in the mornings. She reports that her symptoms lessen throughout the day. No pains in hands or wrist.   Has has been having much more bloating in stomach every time she eats for several weeks. States she stopped eating red meat over the last year due to low cholesterol, but is eating fish and poultry.     No Known Allergies   No current outpatient prescriptions on file.  Review of Systems  Constitutional: Positive for fatigue. Negative for activity change, appetite change, chills, diaphoresis, fever and unexpected weight change.  Respiratory: Negative.   Cardiovascular: Negative.  Negative for chest pain, palpitations and leg swelling.  Musculoskeletal: Negative.   Neurological: Positive for weakness, light-headedness and numbness. Negative for dizziness, tremors, seizures, syncope, facial asymmetry and speech difficulty.  Psychiatric/Behavioral: Negative.     Social History  Substance Use Topics  . Smoking status: Never Smoker  . Smokeless tobacco: Never Used  . Alcohol use No   Objective:   BP 118/68 (BP Location: Left Arm, Patient Position: Sitting, Cuff Size: Normal)   Pulse 86   Temp 98.8 F (37.1 C)   Resp 16   Wt 149 lb (67.6 kg)   LMP 10/08/2016 (Exact Date)   SpO2 98%   BMI 26.39 kg/m   Physical Exam   General Appearance:    Alert, cooperative, no distress  Eyes:    PERRL, conjunctiva/corneas clear, EOM's intact       Lungs:     Clear to auscultation bilaterally, respirations unlabored  Heart:    Regular rate and rhythm    Neurologic:   Awake, alert, oriented x 3. No apparent focal neurological           defect.  CNII-XII grossly intact. +5 muscle strength all extremities. No cerebellar deficits. Normal s/s both extremities.           Assessment & Plan:     1. Numbness and tingling in both hands  - Hemoglobin A1c - VITAMIN D 25 Hydroxy (Vit-D Deficiency, Fractures) - Vitamin B12 - Comprehensive metabolic panel - CBC - Gliadin antibodies, serum - Tissue transglutaminase, IgA - Reticulin Antibody, IgA w reflex titer  2. Abdominal bloating  - Hemoglobin A1c - VITAMIN D 25 Hydroxy (Vit-D Deficiency, Fractures) - Vitamin B12 - Comprehensive metabolic panel - CBC - Gliadin antibodies, serum - Tissue transglutaminase, IgA - Reticulin Antibody, IgA w reflex titer     The entirety of the information documented in the History of Present Illness, Review of Systems and Physical Exam were personally obtained by me. Portions of this information were initially documented by Wilburt Finlay, CMA and reviewed by me for thoroughness and accuracy.    Lelon Huh, MD  [facility]

## 2016-10-09 ENCOUNTER — Telehealth: Payer: Self-pay

## 2016-10-09 NOTE — Telephone Encounter (Signed)
Advised pt of lab results. Pt verbally acknowledges understanding. Sidharth Leverette Drozdowski, CMA   

## 2016-10-09 NOTE — Telephone Encounter (Signed)
-----   Message from Birdie Sons, MD sent at 10/09/2016  8:08 AM EST ----- Vitamin D levels are extremely low at 7.8, should be over 30. This can sometimes cause numbness in extremities. Need to start taking OTC vitamin D3 5,000 units once a day. Results of Celiac test are still pending and should be in by the end of the week.

## 2016-10-10 LAB — CBC
HEMATOCRIT: 43 % (ref 34.0–46.6)
Hemoglobin: 14.3 g/dL (ref 11.1–15.9)
MCH: 28.6 pg (ref 26.6–33.0)
MCHC: 33.3 g/dL (ref 31.5–35.7)
MCV: 86 fL (ref 79–97)
PLATELETS: 402 10*3/uL — AB (ref 150–379)
RBC: 5 x10E6/uL (ref 3.77–5.28)
RDW: 13.7 % (ref 12.3–15.4)
WBC: 6 10*3/uL (ref 3.4–10.8)

## 2016-10-10 LAB — COMPREHENSIVE METABOLIC PANEL
ALBUMIN: 4.7 g/dL (ref 3.5–5.5)
ALK PHOS: 59 IU/L (ref 39–117)
ALT: 14 IU/L (ref 0–32)
AST: 18 IU/L (ref 0–40)
Albumin/Globulin Ratio: 1.8 (ref 1.2–2.2)
BUN / CREAT RATIO: 9 (ref 9–23)
BUN: 7 mg/dL (ref 6–24)
Bilirubin Total: 0.4 mg/dL (ref 0.0–1.2)
CHLORIDE: 102 mmol/L (ref 96–106)
CO2: 23 mmol/L (ref 18–29)
Calcium: 9.5 mg/dL (ref 8.7–10.2)
Creatinine, Ser: 0.79 mg/dL (ref 0.57–1.00)
GFR calc Af Amer: 104 mL/min/{1.73_m2} (ref >59)
GFR calc non Af Amer: 90 mL/min/{1.73_m2} (ref >59)
GLUCOSE: 96 mg/dL (ref 65–99)
Globulin, Total: 2.6 g/dL (ref 1.5–4.5)
Potassium: 4.4 mmol/L (ref 3.5–5.2)
SODIUM: 140 mmol/L (ref 134–144)
Total Protein: 7.3 g/dL (ref 6.0–8.5)

## 2016-10-10 LAB — GLIADIN ANTIBODIES, SERUM
Antigliadin Abs, IgA: 5 units (ref 0–19)
GLIADIN IGG: 2 U (ref 0–19)

## 2016-10-10 LAB — HEMOGLOBIN A1C
ESTIMATED AVERAGE GLUCOSE: 105 mg/dL
HEMOGLOBIN A1C: 5.3 % (ref 4.8–5.6)

## 2016-10-10 LAB — VITAMIN B12: Vitamin B-12: 498 pg/mL (ref 232–1245)

## 2016-10-10 LAB — TISSUE TRANSGLUTAMINASE, IGA

## 2016-10-10 LAB — VITAMIN D 25 HYDROXY (VIT D DEFICIENCY, FRACTURES): VIT D 25 HYDROXY: 7.8 ng/mL — AB (ref 30.0–100.0)

## 2016-10-10 LAB — RETICULIN ANTIBODIES, IGA W TITER: Reticulin Ab, IgA: NEGATIVE titer (ref <2.5)

## 2017-07-30 ENCOUNTER — Encounter: Payer: Self-pay | Admitting: Physician Assistant

## 2017-07-30 ENCOUNTER — Other Ambulatory Visit: Payer: Self-pay

## 2017-07-30 ENCOUNTER — Ambulatory Visit
Admission: RE | Admit: 2017-07-30 | Discharge: 2017-07-30 | Disposition: A | Discharge status: Home / Self Care | Payer: BLUE CROSS/BLUE SHIELD | Source: Ambulatory Visit | Attending: Physician Assistant | Admitting: Physician Assistant

## 2017-07-30 ENCOUNTER — Ambulatory Visit (INDEPENDENT_AMBULATORY_CARE_PROVIDER_SITE_OTHER): Payer: BLUE CROSS/BLUE SHIELD | Admitting: Physician Assistant

## 2017-07-30 VITALS — BP 110/60 | HR 88 | Temp 98.7°F | Resp 16 | Ht 63.0 in | Wt 157.4 lb

## 2017-07-30 DIAGNOSIS — Z0000 Encounter for general adult medical examination without abnormal findings: Secondary | ICD-10-CM

## 2017-07-30 DIAGNOSIS — Z23 Encounter for immunization: Secondary | ICD-10-CM | POA: Diagnosis not present

## 2017-07-30 DIAGNOSIS — K5900 Constipation, unspecified: Secondary | ICD-10-CM

## 2017-07-30 DIAGNOSIS — Z1231 Encounter for screening mammogram for malignant neoplasm of breast: Secondary | ICD-10-CM

## 2017-07-30 DIAGNOSIS — E7800 Pure hypercholesterolemia, unspecified: Secondary | ICD-10-CM | POA: Diagnosis not present

## 2017-07-30 DIAGNOSIS — R3589 Other polyuria: Secondary | ICD-10-CM

## 2017-07-30 DIAGNOSIS — N898 Other specified noninflammatory disorders of vagina: Secondary | ICD-10-CM

## 2017-07-30 DIAGNOSIS — R1901 Right upper quadrant abdominal swelling, mass and lump: Secondary | ICD-10-CM | POA: Diagnosis not present

## 2017-07-30 DIAGNOSIS — R358 Other polyuria: Secondary | ICD-10-CM

## 2017-07-30 DIAGNOSIS — R140 Abdominal distension (gaseous): Secondary | ICD-10-CM

## 2017-07-30 LAB — POCT URINALYSIS DIPSTICK
Bilirubin, UA: NEGATIVE
Blood, UA: NEGATIVE
Glucose, UA: NEGATIVE
Ketones, UA: NEGATIVE
LEUKOCYTES UA: NEGATIVE
Nitrite, UA: NEGATIVE
PROTEIN UA: NEGATIVE
SPEC GRAV UA: 1.01 (ref 1.010–1.025)
UROBILINOGEN UA: 0.2 U/dL
pH, UA: 8 (ref 5.0–8.0)

## 2017-07-30 MED ORDER — METRONIDAZOLE 500 MG PO TABS: 500.0000 mg | ORAL_TABLET | Freq: Two times a day (BID) | ORAL | 0 refills | Status: DC

## 2017-07-30 NOTE — Progress Notes (Signed)
Patient: Suzanne Sanchez, Female    DOB: 08/09/1971, 46 y.o.   MRN: [id number] Visit Date: 07/30/2017  Today's Provider: Mar Daring, PA-C   Chief Complaint  Patient presents with  . Annual Exam   Subjective:    Annual physical exam Suzanne Sanchez is a 46 y.o. female who presents today for health maintenance and complete physical. She feels fairly well. She reports exercising, walks daily. She reports she is sleeping poorly, reports sleep about 4 hours. She reports that she took 1/2 (half) of her husband Alprazolam and helped her sleep. Per patient she [facility]. Not sure of specific date.   CPE:07/31/16 Pap:07/31/16 Neg, HPV-Negative  Patient also reports that her back has been hurting for the past 2 months and is waking her up from her sleep. Reports no injury.  She is also having abdominal pain, bloating, polyuria, vaginal discharge, and vaginal odor. She reports it has like "fishy" smell. Denies dysuria, itching,fever and no recent antibiotic. She has been with the same partner for years.  Review of Systems  Constitutional: Positive for diaphoresis and unexpected weight change.  HENT: Negative.   Eyes: Negative.   Respiratory: Negative.   Cardiovascular: Negative.   Gastrointestinal: Positive for abdominal distention and abdominal pain.  Endocrine: Positive for polyuria.  Genitourinary: Positive for vaginal discharge.  Musculoskeletal: Positive for back pain.  Skin: Negative.   Allergic/Immunologic: Negative.   Neurological: Positive for dizziness and light-headedness.  Hematological: Negative.   Psychiatric/Behavioral: Positive for sleep disturbance.    Social History      She  reports that  has never smoked. she has never used smokeless tobacco. She reports that she does not drink alcohol or use drugs.       Social History   Socioeconomic History  . Marital status: Married    Spouse name: None   . Number of children: 2  . Years of education: college  . Highest education level: None  Social Needs  . Financial resource strain: None  . Food insecurity - worry: None  . Food insecurity - inability: None  . Transportation needs - medical: None  . Transportation needs - non-medical: None  Occupational History  . 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 Smoking status: Never Smoker  . Smokeless tobacco: Never Used  Substance and Sexual Activity  . Alcohol use: No    Alcohol/week: 0.0 oz  . Drug use: No  . Sexual activity: None  Other Topics Concern  . None  Social History Narrative  . None    Past Medical History:  Diagnosis Date  . Anxiety   . History of chicken pox   . Hyperlipidemia   . Insomnia      Patient Active Problem List   Diagnosis Date Noted  . Sebaceous cyst 03/06/2016  . Pure hypercholesterolemia 04/07/2015    Past Surgical History:  Procedure Laterality Date  . TUBAL LIGATION  1993    Family History        No family status information on file.        Her family history is not on file. She was adopted.     No Known Allergies  No current outpatient medications on file.   Patient Care Team: Birdie Sons, MD as PCP - General (Family Medicine) Caryn Section Kirstie Peri, MD as Referring Physician (Family Medicine) Hervey Ard  W, MD (General Surgery)      Objective:   Vitals: BP 110/60 (BP Location: Left Arm, Patient Position: Sitting, Cuff Size: Normal)   Pulse 88   Temp 98.7 F (37.1 C) (Oral)   Resp 16   Ht 5\' 3"  (1.6 m)   Wt 157 lb 6.4 oz (71.4 kg)   BMI 27.88 kg/m     Physical Exam  Constitutional: She is oriented to person, place, and time. She appears well-developed and well-nourished. No distress.  HENT:  Head: Normocephalic and atraumatic.  Right Ear: Hearing, tympanic membrane, external ear and ear canal normal.  Left Ear: Hearing, tympanic  membrane, external ear and ear canal normal.  Nose: Nose normal.  Mouth/Throat: Uvula is midline, oropharynx is clear and moist and mucous membranes are normal. No oropharyngeal exudate.  Eyes: Conjunctivae and EOM are normal. Pupils are equal, round, and reactive to light. Right eye exhibits no discharge. Left eye exhibits no discharge. No scleral icterus.  Neck: Normal range of motion. Neck supple. No JVD present. Carotid bruit is not present. No tracheal deviation present. No thyromegaly present.  Cardiovascular: Normal rate, regular rhythm, normal heart sounds and intact distal pulses. Exam reveals no gallop and no friction rub.  No murmur heard. Pulmonary/Chest: Effort normal and breath sounds normal. No respiratory distress. She has no wheezes. She has no rales. She exhibits no tenderness. Right breast exhibits no inverted nipple, no mass, no nipple discharge, no skin change and no tenderness. Left breast exhibits no inverted nipple, no mass, no nipple discharge, no skin change and no tenderness. Breasts are symmetrical.  Abdominal: Soft. Bowel sounds are normal. She exhibits no distension and no mass. There is no tenderness. There is no rebound and no guarding. Hernia confirmed negative in the right inguinal area and confirmed negative in the left inguinal area.  Genitourinary: Rectum normal and uterus normal. No breast swelling, tenderness, discharge or bleeding. Pelvic exam was performed with patient supine. There is no rash, tenderness, lesion or injury on the right labia. There is no rash, tenderness, lesion or injury on the left labia. Cervix exhibits no motion tenderness, no discharge and no friability. Right adnexum displays no mass, no tenderness and no fullness. Left adnexum displays no mass, no tenderness and no fullness. No erythema, tenderness or bleeding in the vagina. No signs of injury around the vagina. Vaginal discharge (white, milky discharge with fishy odor) found.    Musculoskeletal: Normal range of motion. She exhibits no edema or tenderness.  Lymphadenopathy:    She has no cervical adenopathy.       Right: No inguinal adenopathy present.       Left: No inguinal adenopathy present.  Neurological: She is alert and oriented to person, place, and time. She has normal reflexes. No cranial nerve deficit. Coordination normal.  Skin: Skin is warm and dry. No rash noted. She is not diaphoretic.  Psychiatric: She has a normal mood and affect. Her behavior is normal. Judgment and thought content normal.  Vitals reviewed.    Depression Screen PHQ 2/9 Scores 07/30/2017 04/08/2015  PHQ - 2 Score 2 0  PHQ- 9 Score 8 1      Assessment & Plan:     Routine Health Maintenance and Physical Exam  Exercise Activities and Dietary recommendations Goals    None      Immunization History  Administered Date(s) Administered  . Tdap 09/26/2006    Health Maintenance  Topic Date Due  . HIV Screening  07/19/1985  .  TETANUS/TDAP  09/26/2016  . INFLUENZA VACCINE  04/10/2017  . PAP SMEAR  08/01/2019     Discussed health benefits of physical activity, and encouraged her to engage in regular exercise appropriate for her age and condition.    1. Annual physical exam Normal physical exam today. Will check labs as below and f/u pending lab results. If labs are stable and WNL she will not need to have these rechecked for one year at her next annual physical exam. She is to call the office in the meantime if she has any acute issue, questions or concerns. - CBC with Differential/Platelet - Comprehensive metabolic panel - Hemoglobin A1c - TSH  2. Encounter for screening mammogram for breast cancer Patient is going to call [facility] for mammogram.  3. Pure hypercholesterolemia Previously on atorvastatin 10mg . Patient reports making lifestyle modifications and increasing exercise and stopping the atorvastatin on her own. Will check labs as below and f/u  pending results. - Comprehensive metabolic panel - Lipid panel  4. Polyuria UA normal. Suspect BV.  - POCT urinalysis dipstick - metroNIDAZOLE (FLAGYL) 500 MG tablet; Take 1 tablet (500 mg total) 2 (two) times daily by mouth.  Dispense: 14 tablet; Refill: 0  5. Vaginal discharge Sureswab collected today to verify BV. Patient declines STD testing. Will treat with metronbidazole as below.  - SureSwab Bacterial Vaginosis/itis - metroNIDAZOLE (FLAGYL) 500 MG tablet; Take 1 tablet (500 mg total) 2 (two) times daily by mouth.  Dispense: 14 tablet; Refill: 0  6. Abdominal bloating Possibly due to BV, but patient does have constipation at baseline and has been awakening with midline back pain. She states pain feels deep. Does not feel muscular in nature. Does report increased menstrual cycles. Will get abd xray as below to see if constipation noted. If abd xray normal may consider pelvic US to r/o fibroid. I will f/u pending results.  - DG Abd 2 Views; Future - metroNIDAZOLE (FLAGYL) 500 MG tablet; Take 1 tablet (500 mg total) 2 (two) times daily by mouth.  Dispense: 14 tablet; Refill: 0  7. Constipation, unspecified constipation type See above medical treatment plan. - DG Abd 2 Views; Future  8. Need for tetanus booster Td booster given to patient without complications. Patient sat for 15 minutes after administration and was tolerated well without adverse effects. - Td : Tetanus/diphtheria >7yo Preservative  free  --------------------------------------------------------------------    Mar Daring, PA-C  [facility]

## 2017-07-30 NOTE — Patient Instructions (Signed)

## 2017-07-31 LAB — HEMOGLOBIN A1C
EAG (MMOL/L): 5.7 (calc)
Hgb A1c MFr Bld: 5.2 % of total Hgb (ref <5.7)
MEAN PLASMA GLUCOSE: 103 (calc)

## 2017-07-31 LAB — CBC WITH DIFFERENTIAL/PLATELET
BASOS ABS: 39 {cells}/uL (ref 0–200)
Basophils Relative: 0.6 %
EOS ABS: 72 {cells}/uL (ref 15–500)
Eosinophils Relative: 1.1 %
HCT: 40.5 % (ref 35.0–45.0)
Hemoglobin: 13.5 g/dL (ref 11.7–15.5)
LYMPHS ABS: 1840 {cells}/uL (ref 850–3900)
MCH: 28.7 pg (ref 27.0–33.0)
MCHC: 33.3 g/dL (ref 32.0–36.0)
MCV: 86.2 fL (ref 80.0–100.0)
MPV: 10.1 fL (ref 7.5–12.5)
Monocytes Relative: 6.5 %
NEUTROS ABS: 4128 {cells}/uL (ref 1500–7800)
Neutrophils Relative %: 63.5 %
PLATELETS: 342 10*3/uL (ref 140–400)
RBC: 4.7 10*6/uL (ref 3.80–5.10)
RDW: 12.6 % (ref 11.0–15.0)
Total Lymphocyte: 28.3 %
WBC mixed population: 423 cells/uL (ref 200–950)
WBC: 6.5 10*3/uL (ref 3.8–10.8)

## 2017-07-31 LAB — LIPID PANEL
Cholesterol: 217 mg/dL — ABNORMAL HIGH (ref <200)
HDL: 64 mg/dL (ref >50)
LDL Cholesterol (Calc): 133 mg/dL (calc) — ABNORMAL HIGH
NON-HDL CHOLESTEROL (CALC): 153 mg/dL — AB (ref <130)
Total CHOL/HDL Ratio: 3.4 (calc) (ref <5.0)
Triglycerides: 94 mg/dL (ref <150)

## 2017-07-31 LAB — COMPLETE METABOLIC PANEL WITH GFR
AG Ratio: 1.7 (calc) (ref 1.0–2.5)
ALT: 14 U/L (ref 6–29)
AST: 18 U/L (ref 10–35)
Albumin: 4.2 g/dL (ref 3.6–5.1)
Alkaline phosphatase (APISO): 47 U/L (ref 33–115)
BUN: 8 mg/dL (ref 7–25)
CO2: 29 mmol/L (ref 20–32)
CREATININE: 0.73 mg/dL (ref 0.50–1.10)
Calcium: 9.3 mg/dL (ref 8.6–10.2)
Chloride: 105 mmol/L (ref 98–110)
GFR, EST AFRICAN AMERICAN: 114 mL/min/{1.73_m2} (ref >=60)
GFR, EST NON AFRICAN AMERICAN: 98 mL/min/{1.73_m2} (ref >=60)
GLUCOSE: 102 mg/dL — AB (ref 65–99)
Globulin: 2.5 g/dL (calc) (ref 1.9–3.7)
Potassium: 4.3 mmol/L (ref 3.5–5.3)
Sodium: 139 mmol/L (ref 135–146)
TOTAL PROTEIN: 6.7 g/dL (ref 6.1–8.1)
Total Bilirubin: 0.5 mg/dL (ref 0.2–1.2)

## 2017-07-31 LAB — TSH: TSH: 1.23 mIU/L

## 2017-07-31 NOTE — Progress Notes (Signed)
Advised 

## 2017-08-05 ENCOUNTER — Telehealth: Payer: Self-pay

## 2017-08-05 LAB — SURESWAB BACTERIAL VAGINOSIS/ITIS
Atopobium vaginae: 6 Log (cells/mL)
C. GLABRATA, DNA: NOT DETECTED
C. PARAPSILOSIS, DNA: NOT DETECTED
C. TROPICALIS, DNA: NOT DETECTED
C. albicans, DNA: NOT DETECTED
Gardnerella vaginalis: >8 Log (cells/mL)
LACTOBACILLUS SPECIES: NOT DETECTED Log (cells/mL)
MEGASPHAERA SPECIES: 7.6 Log (cells/mL)
Trichomonas vaginalis RNA: NOT DETECTED

## 2017-08-05 NOTE — Telephone Encounter (Signed)
Patient advised as below.  

## 2017-08-05 NOTE — Telephone Encounter (Signed)
-----   Message from Mar Daring, [place] sent at 08/05/2017 10:34 AM EST ----- Testing was positive for BV which I have treated you for. All other testing was negative. Please call if symptoms are still present.

## 2017-11-28 ENCOUNTER — Ambulatory Visit: Payer: BLUE CROSS/BLUE SHIELD | Admitting: Family Medicine

## 2017-11-28 ENCOUNTER — Encounter: Payer: Self-pay | Admitting: Family Medicine

## 2017-11-28 VITALS — BP 108/70 | HR 96 | Temp 98.0°F | Resp 16 | Wt 158.0 lb

## 2017-11-28 DIAGNOSIS — R202 Paresthesia of skin: Secondary | ICD-10-CM

## 2017-11-28 DIAGNOSIS — R200 Anesthesia of skin: Secondary | ICD-10-CM | POA: Diagnosis not present

## 2017-11-28 DIAGNOSIS — E559 Vitamin D deficiency, unspecified: Secondary | ICD-10-CM

## 2017-11-28 NOTE — Progress Notes (Signed)
       Patient: Suzanne Sanchez Female    DOB: July 20, 1971   47 y.o.   MRN: [id number] Visit Date: 11/28/2017  Today's Provider: Lelon Huh, MD   Chief Complaint  Patient presents with  . Arm Pain   Subjective:    Arm Pain   The incident occurred 3 to 5 days ago. There was no injury mechanism. The quality of the pain is described as aching, burning and shooting. The pain radiates to the left hand, left arm and back. The pain is mild. Associated symptoms include numbness and tingling. Pertinent negatives include no chest pain or muscle weakness. She has tried nothing for the symptoms.   Patient describes pain as a numbness and tingling sensation. She has taken an baby aspirin for the last 3 days to help with the pain. It is constant and sometimes wakes her at night. Is present when she wakes in the morning. No precipitating factors. She is concerned it may be related to her heart as she has a family history of heart disease.   She also has a history of very low vitamin d and took OTC supplements last year which she has since discontinued.  Lab Results  Component Value Date   VD25OH 7.8 (L) 10/08/2016      No Known Allergies  No current outpatient medications on file.  Review of Systems  Constitutional: Negative for appetite change, chills, fatigue and fever.  Respiratory: Negative for chest tightness and shortness of breath.   Cardiovascular: Negative for chest pain and palpitations.  Gastrointestinal: Negative for abdominal pain, nausea and vomiting.  Neurological: Positive for tingling and numbness. Negative for dizziness and weakness.    Social History   Tobacco Use  . Smoking status: Never Smoker  . Smokeless tobacco: Never Used  Substance Use Topics  . Alcohol use: No    Alcohol/week: 0.0 oz   Objective:   BP 108/70 (BP Location: Right Arm, Patient Position: Sitting, Cuff Size: Normal)   Pulse 96   Temp 98 F (36.7 C)   Resp 16   Wt 158 lb (71.7 kg)    SpO2 99%   BMI 27.99 kg/m  Vitals:   11/28/17 0907  BP: 108/70  Pulse: 96  Resp: 16  Temp: 98 F (36.7 C)  SpO2: 99%  Weight: 158 lb (71.7 kg)     Physical Exam   General Appearance:    Alert, cooperative, no distress  Eyes:    PERRL, conjunctiva/corneas clear, EOM's intact       Lungs:     Clear to auscultation bilaterally, respirations unlabored  Heart:    Regular rate and rhythm  Neurologic:   Awake, alert, oriented x 3. No apparent focal neurological           defect.   MS:   No neck or spine tenderness. FROM of next and shoulder. +5 muscle strength       Assessment & Plan:     1. Numbness and tingling of left hand Similar symptoms in past secondary to vitamin deficiency.  Check troponin to rule out cardiac etiology - VITAMIN D 25 Hydroxy (Vit-D Deficiency, Fractures)  2. Vitamin D deficiency Currently off of vitamin d supplementation - VITAMIN D 25 Hydroxy (Vit-D Deficiency, Fractures)       Lelon Huh, MD  [facility]

## 2017-11-29 ENCOUNTER — Telehealth: Payer: Self-pay

## 2017-11-29 DIAGNOSIS — R202 Paresthesia of skin: Principal | ICD-10-CM

## 2017-11-29 DIAGNOSIS — R200 Anesthesia of skin: Secondary | ICD-10-CM

## 2017-11-29 LAB — TROPONIN I

## 2017-11-29 LAB — VITAMIN D 25 HYDROXY (VIT D DEFICIENCY, FRACTURES): Vit D, 25-Hydroxy: 27.2 ng/mL — ABNORMAL LOW (ref 30.0–100.0)

## 2017-11-29 NOTE — Telephone Encounter (Signed)
-----   Message from Birdie Sons, MD sent at 11/29/2017 10:02 AM EDT ----- Vitamin d level is low, but nearly as low as it was last time. She should take a daily vitamin D3 1,000 units.  Cardiac enzymes are normal.  Tingling is likely due to pinched nerve. It could be from cervical spine or neck muscles. Recommedn she get XR c-spine and start taking 2 Aleve twice a day.

## 2017-12-23 ENCOUNTER — Ambulatory Visit
Admission: RE | Admit: 2017-12-23 | Discharge: 2017-12-23 | Disposition: A | Discharge status: Home / Self Care | Payer: BLUE CROSS/BLUE SHIELD | Source: Ambulatory Visit | Attending: Family Medicine | Admitting: Family Medicine

## 2017-12-23 ENCOUNTER — Encounter: Payer: Self-pay | Admitting: Physician Assistant

## 2017-12-23 ENCOUNTER — Ambulatory Visit: Payer: BLUE CROSS/BLUE SHIELD | Admitting: Physician Assistant

## 2017-12-23 VITALS — BP 116/78 | HR 84 | Temp 98.7°F | Resp 16 | Wt 155.0 lb

## 2017-12-23 DIAGNOSIS — R202 Paresthesia of skin: Secondary | ICD-10-CM | POA: Insufficient documentation

## 2017-12-23 DIAGNOSIS — R2231 Localized swelling, mass and lump, right upper limb: Secondary | ICD-10-CM

## 2017-12-23 DIAGNOSIS — L539 Erythematous condition, unspecified: Secondary | ICD-10-CM | POA: Diagnosis not present

## 2017-12-23 DIAGNOSIS — R200 Anesthesia of skin: Secondary | ICD-10-CM | POA: Insufficient documentation

## 2017-12-23 DIAGNOSIS — W5501XA Bitten by cat, initial encounter: Secondary | ICD-10-CM | POA: Diagnosis not present

## 2017-12-23 MED ORDER — AMOXICILLIN-POT CLAVULANATE 875-125 MG PO TABS: 1.0000 | ORAL_TABLET | Freq: Two times a day (BID) | ORAL | 0 refills | Status: AC

## 2017-12-23 NOTE — Patient Instructions (Signed)
Animal Bite Animal bite wounds can get infected. It is important to get proper medical treatment. Ask your doctor if you need rabies treatment. Follow these instructions at home: Wound care  Follow instructions from your doctor about how to take care of your wound. Make sure you: ? Wash your hands with soap and water before you change your bandage (dressing). If you cannot use soap and water, use hand sanitizer. ? Change your bandage as told by your doctor. ? Leave stitches (sutures), skin glue, or skin tape (adhesive) strips in place. They may need to stay in place for 2 weeks or longer. If tape strips get loose and curl up, you may trim the loose edges. Do not remove tape strips completely unless your doctor says it is okay.  Check your wound every day for signs of infection. Watch for: ? Redness, swelling, or pain that gets worse. ? Fluid, blood, or pus. General instructions  Take or apply over-the-counter and prescription medicines only as told by your doctor.  If you were prescribed an antibiotic, take or apply it as told by your doctor. Do not stop using the antibiotic even if your condition improves.  Keep the injured area raised (elevated) above the level of your heart while you are sitting or lying down.  If directed, apply ice to the injured area. ? Put ice in a plastic bag. ? Place a towel between your skin and the bag. ? Leave the ice on for 20 minutes, 2-3 times per day.  Keep all follow-up visits as told by your doctor. This is important. Contact a doctor if:  You have redness, swelling, or pain that gets worse.  You have a general feeling of sickness (malaise).  You feel sick to your stomach (nauseous).  You throw up (vomit).  You have pain that does not get better. Get help right away if:  You have a red streak going away from your wound.  You have fluid, blood, or pus coming from your wound.  You have a fever or chills.  You have trouble moving your  injured area.  You have numbness or tingling anywhere on your body. This information is not intended to replace advice given to you by your health care provider. Make sure you discuss any questions you have with your health care provider. Document Released: 08/27/2005 Document Revised: 02/02/2016 Document Reviewed: 01/12/2015 Elsevier Interactive Patient Education  2018 Elsevier Inc.  

## 2017-12-23 NOTE — Progress Notes (Signed)
       Patient: Suzanne Sanchez Female    DOB: November 07, 1970   47 y.o.   MRN: [id number] Visit Date: 12/23/2017  Today's Provider: Trinna Post, PA-C   Chief Complaint  Patient presents with  . Animal Bite    Pt's cat bit pt yesterday.   Subjective:    Suzanne Sanchez is a 47 y/o woman presenting today with cat bite x 24 hours. She reports her cat bit her on her left fourth finger yesterday night. The cat is UTD on all vaccinations. This patient had a tetanus vaccination last year. She reports she washed the injury with soap and water and applied ointment. Over the course of 24 hours, redness and swelling has worsened. She still maintains motion of all digits bilaterally. She denies fever, chills, N/V.   Animal Bite   The incident occurred yesterday. The incident occurred at home. There is an injury to the left ring finger. The pain is mild. It is unlikely that a foreign body is present. Her tetanus status is UTD.       No Known Allergies  No current outpatient medications on file.  Review of Systems  Constitutional: Negative.   Musculoskeletal: Positive for joint swelling.  Skin: Positive for wound. Negative for color change, pallor and rash.    Social History   Tobacco Use  . Smoking status: Never Smoker  . Smokeless tobacco: Never Used  Substance Use Topics  . Alcohol use: No    Alcohol/week: 0.0 oz   Objective:   BP 116/78 (BP Location: Right Arm, Patient Position: Sitting, Cuff Size: Normal)   Pulse 84   Temp 98.7 F (37.1 C) (Oral)   Resp 16   Wt 155 lb (70.3 kg)   LMP 12/22/2017   BMI 27.46 kg/m  Vitals:   12/23/17 1306  BP: 116/78  Pulse: 84  Resp: 16  Temp: 98.7 F (37.1 C)  TempSrc: Oral  Weight: 155 lb (70.3 kg)     Physical Exam  Constitutional: She is oriented to person, place, and time. She appears well-developed and well-nourished.  Cardiovascular: Normal rate and regular rhythm.  Pulmonary/Chest: Effort normal and breath sounds  normal.  Musculoskeletal: Normal range of motion. She exhibits edema. She exhibits no tenderness.       Hands: Neurological: She is alert and oriented to person, place, and time.        Assessment & Plan:     1. Cat bite, initial encounter  Start antibiotics as below. Patient retains full ROM without pain. She is to call back immediately if she experiences worsening pain, swelling, or loss of motion in this joint and we will likely have to refer urgently to ortho.   - amoxicillin-clavulanate (AUGMENTIN) 875-125 MG tablet; Take 1 tablet by mouth 2 (two) times daily for 7 days.  Dispense: 14 tablet; Refill: 0  Return if symptoms worsen or fail to improve.  The entirety of the information documented in the History of Present Illness, Review of Systems and Physical Exam were personally obtained by me. Portions of this information were initially documented by Ashley Royalty, CMA and reviewed by me for thoroughness and accuracy.        Trinna Post, PA-C  [facility]

## 2017-12-25 ENCOUNTER — Telehealth: Payer: Self-pay | Admitting: *Deleted

## 2017-12-25 DIAGNOSIS — R202 Paresthesia of skin: Principal | ICD-10-CM

## 2017-12-25 DIAGNOSIS — R200 Anesthesia of skin: Secondary | ICD-10-CM

## 2017-12-25 NOTE — Telephone Encounter (Signed)
-----   Message from Birdie Sons, MD sent at 12/24/2017  3:38 PM EDT ----- Suzanne Sanchez is normal. Nothing to explain tingling in her left arm. If not doing better then recommend referral to neurology for nerve tests.

## 2017-12-25 NOTE — Telephone Encounter (Signed)
LMOVM for pt to return call 

## 2017-12-25 NOTE — Telephone Encounter (Signed)
Patient was notified of results. Expressed understanding. Referral in epic. 

## 2017-12-25 NOTE — Telephone Encounter (Signed)
Patient is returning your call and requesting a call back. °

## 2017-12-25 NOTE — Telephone Encounter (Signed)
Please schedule referral to neurology? Thanks!

## 2018-10-02 ENCOUNTER — Encounter: Payer: Self-pay | Admitting: Physician Assistant

## 2018-10-02 ENCOUNTER — Ambulatory Visit: Payer: BLUE CROSS/BLUE SHIELD | Admitting: Physician Assistant

## 2018-10-02 VITALS — BP 114/73 | HR 90 | Temp 98.6°F | Resp 16 | Wt 159.2 lb

## 2018-10-02 DIAGNOSIS — Z23 Encounter for immunization: Secondary | ICD-10-CM

## 2018-10-02 DIAGNOSIS — R300 Dysuria: Secondary | ICD-10-CM

## 2018-10-02 DIAGNOSIS — N3090 Cystitis, unspecified without hematuria: Secondary | ICD-10-CM | POA: Diagnosis not present

## 2018-10-02 LAB — POCT URINALYSIS DIPSTICK
Bilirubin, UA: NEGATIVE
Blood, UA: NEGATIVE
Glucose, UA: NEGATIVE
Ketones, UA: NEGATIVE
Nitrite, UA: NEGATIVE
Protein, UA: NEGATIVE
Spec Grav, UA: 1.01 (ref 1.010–1.025)
Urobilinogen, UA: 0.2 E.U./dL
pH, UA: 6 (ref 5.0–8.0)

## 2018-10-02 MED ORDER — SULFAMETHOXAZOLE-TRIMETHOPRIM 800-160 MG PO TABS: 1.0000 | ORAL_TABLET | Freq: Two times a day (BID) | ORAL | 0 refills | Status: AC

## 2018-10-02 NOTE — Progress Notes (Signed)
       Patient: Suzanne Sanchez Female    DOB: 13-Jul-1971   48 y.o.   MRN: [id number] Visit Date: 10/08/2018  Today's Provider: Trinna Post, PA-C   Chief Complaint  Patient presents with  . Urinary Tract Infection   Subjective:     HPI Urinary Tract Infection: Patient complains of burning with urination She has had symptoms for 4 days. Patient also complains of back pain. Patient denies fever. Patient does not have a history of recurrent UTI.  Patient does not have a history of pyelonephritis.  No abdominal pain, vomiting, vaginal discharge   No Known Allergies  No current outpatient medications on file.  Review of Systems  Constitutional: Negative.   Genitourinary: Positive for dysuria and flank pain.    Social History   Tobacco Use  . Smoking status: Never Smoker  . Smokeless tobacco: Never Used  Substance Use Topics  . Alcohol use: No    Alcohol/week: 0.0 standard drinks      Objective:   BP 114/73 (BP Location: Left Arm, Patient Position: Sitting, Cuff Size: Normal)   Pulse 90   Temp 98.6 F (37 C) (Oral)   Resp 16   Wt 159 lb 3.2 oz (72.2 kg)   BMI 28.20 kg/m  Vitals:   10/02/18 1514  BP: 114/73  Pulse: 90  Resp: 16  Temp: 98.6 F (37 C)  TempSrc: Oral  Weight: 159 lb 3.2 oz (72.2 kg)     Physical Exam Constitutional:      Appearance: She is well-developed.  HENT:     Right Ear: External ear normal.     Left Ear: External ear normal.     Mouth/Throat:     Pharynx: No oropharyngeal exudate.  Eyes:     General:        Right eye: Discharge present.        Left eye: Discharge present. Neck:     Musculoskeletal: Neck supple.  Cardiovascular:     Rate and Rhythm: Normal rate and regular rhythm.  Pulmonary:     Effort: Pulmonary effort is normal. No respiratory distress.     Breath sounds: Normal breath sounds. No rales.  Lymphadenopathy:     Cervical: No cervical adenopathy.  Skin:    General: Skin is warm and dry.  Psychiatric:         Behavior: Behavior normal.         Assessment & Plan    1. Dysuria  - POCT urinalysis dipstick - Urine Culture  2. Cystitis  - sulfamethoxazole-trimethoprim (BACTRIM DS,SEPTRA DS) 800-160 MG tablet; Take 1 tablet by mouth 2 (two) times daily for 3 days.  Dispense: 6 tablet; Refill: 0  3. Need for influenza vaccination  - Flu Vaccine QUAD 36+ mos IM  The entirety of the information documented in the History of Present Illness, Review of Systems and Physical Exam were personally obtained by me. Portions of this information were initially documented by Lynford Humphrey, CMA and reviewed by me for thoroughness and accuracy.   Return if symptoms worsen or fail to improve.      Trinna Post, PA-C  [facility]

## 2018-10-02 NOTE — Patient Instructions (Signed)

## 2018-10-04 LAB — URINE CULTURE

## 2018-10-06 ENCOUNTER — Telehealth: Payer: Self-pay

## 2018-10-06 NOTE — Telephone Encounter (Signed)
-----   Message from Trinna Post, [place] sent at 10/06/2018  1:47 PM EST ----- Urine culture did not show bacterial infection.

## 2018-10-06 NOTE — Telephone Encounter (Signed)
Patient advised as below.  

## 2018-10-16 ENCOUNTER — Encounter: Payer: BLUE CROSS/BLUE SHIELD | Admitting: Physician Assistant

## 2018-10-30 ENCOUNTER — Ambulatory Visit (INDEPENDENT_AMBULATORY_CARE_PROVIDER_SITE_OTHER): Payer: BLUE CROSS/BLUE SHIELD | Admitting: Physician Assistant

## 2018-10-30 ENCOUNTER — Encounter: Payer: Self-pay | Admitting: Physician Assistant

## 2018-10-30 VITALS — BP 115/80 | HR 80 | Temp 98.0°F | Resp 16 | Ht 63.0 in | Wt 160.0 lb

## 2018-10-30 DIAGNOSIS — Z114 Encounter for screening for human immunodeficiency virus [HIV]: Secondary | ICD-10-CM

## 2018-10-30 DIAGNOSIS — Z131 Encounter for screening for diabetes mellitus: Secondary | ICD-10-CM

## 2018-10-30 DIAGNOSIS — Z1329 Encounter for screening for other suspected endocrine disorder: Secondary | ICD-10-CM | POA: Diagnosis not present

## 2018-10-30 DIAGNOSIS — Z13220 Encounter for screening for lipoid disorders: Secondary | ICD-10-CM

## 2018-10-30 DIAGNOSIS — Z130 Encounter for screening for diseases of the blood and blood-forming organs and certain disorders involving the immune mechanism: Secondary | ICD-10-CM

## 2018-10-30 DIAGNOSIS — Z0000 Encounter for general adult medical examination without abnormal findings: Secondary | ICD-10-CM | POA: Diagnosis not present

## 2018-10-30 DIAGNOSIS — R21 Rash and other nonspecific skin eruption: Secondary | ICD-10-CM | POA: Diagnosis not present

## 2018-10-30 MED ORDER — TRIAMCINOLONE ACETONIDE 0.1 % EX CREA: 1.0000 "application " | TOPICAL_CREAM | Freq: Two times a day (BID) | CUTANEOUS | 0 refills | Status: DC

## 2018-10-30 NOTE — Progress Notes (Signed)
Patient: Suzanne Sanchez, Female    DOB: 06-21-1971, 48 y.o.   MRN: [id number] Visit Date: 10/30/2018  Today's Provider: Trinna Post, PA-C   Chief Complaint  Patient presents with  . Annual Exam   Subjective:     Annual physical exam Suzanne Sanchez is a 48 y.o. female who presents today for health maintenance and complete physical. She feels well. She reports exercising none. She reports she is sleeping fairly well.  07/31/16 Pap-normal/HPV-negative Mammo: No family history, last mammo 5 years ago normal.   Has rash on buttocks that reoccurs, is itchy, looks like blisters.  -----------------------------------------------------------------   Review of Systems  Constitutional: Negative.   HENT: Positive for hearing loss.   Eyes: Negative.   Respiratory: Negative.   Cardiovascular: Negative.   Gastrointestinal: Negative.   Endocrine: Positive for polydipsia and polyphagia.  Genitourinary: Negative.   Musculoskeletal: Negative.   Skin: Negative.   Allergic/Immunologic: Negative.   Neurological: Negative.   Hematological: Negative.   Psychiatric/Behavioral: Negative.     Social History      She  reports that she has never smoked. She has never used smokeless tobacco. She reports that she does not drink alcohol or use drugs.       Social History   Socioeconomic History  . Marital status: Married    Spouse name: Not on file  . Number of children: 2  . Years of education: college  . Highest education level: Not on file  Occupational History  . 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 Financial resource strain: Not on file  . Food insecurity:    Worry: Not on file    Inability: Not on file  . Transportation needs:    Medical: Not on file    Non-medical: Not on file  Tobacco Use  . Smoking status: Never Smoker  . Smokeless tobacco: Never Used  Substance and Sexual  Activity  . Alcohol use: No    Alcohol/week: 0.0 standard drinks  . Drug use: No  . Sexual activity: Not on file  Lifestyle  . Physical activity:    Days per week: Not on file    Minutes per session: Not on file  . Stress: Not on file  Relationships  . Social connections:    Talks on phone: Not on file    Gets together: Not on file    Attends religious service: Not on file    Active member of club or organization: Not on file    Attends meetings of clubs or organizations: Not on file    Relationship status: Not on file  Other Topics Concern  . Not on file  Social History Narrative  . Not on file    Past Medical History:  Diagnosis Date  . Anxiety   . History of chicken pox   . Hyperlipidemia   . Insomnia      Patient Active Problem List   Diagnosis Date Noted  . Vitamin D deficiency 11/28/2017  . Sebaceous cyst 03/06/2016  . Pure hypercholesterolemia 04/07/2015    Past Surgical History:  Procedure Laterality Date  . TUBAL LIGATION  1993    Family History        No family status information on file.        Her family history is not on file. She was adopted.      No Known Allergies   Current  Outpatient Medications:  .  triamcinolone cream (KENALOG) 0.1 %, Apply 1 application topically 2 (two) times daily., Disp: 30 g, Rfl: 0   Patient Care Team: Birdie Sons, MD as PCP - General (Family Medicine) Birdie Sons, MD as Referring Physician (Family Medicine) Robert Bellow, MD (General Surgery)    Objective:    Vitals: BP 115/80 (BP Location: Left Arm, Patient Position: Sitting, Cuff Size: Normal)   Pulse 80   Temp 98 F (36.7 C) (Oral)   Resp 16   Ht 5\' 3"  (1.6 m)   Wt 160 lb (72.6 kg)   LMP 10/22/2018 (Approximate)   BMI 28.34 kg/m    Vitals:   10/30/18 0811  BP: 115/80  Pulse: 80  Resp: 16  Temp: 98 F (36.7 C)  TempSrc: Oral  Weight: 160 lb (72.6 kg)  Height: 5\' 3"  (1.6 m)     Physical Exam Constitutional:       Appearance: Normal appearance. She is normal weight.  Cardiovascular:     Rate and Rhythm: Normal rate and regular rhythm.  Pulmonary:     Breath sounds: Normal breath sounds.  Chest:     Breasts: Breasts are symmetrical.        Right: Normal.        Left: Normal.  Abdominal:     General: Abdomen is flat.     Palpations: Abdomen is soft.  Skin:    General: Skin is warm and dry.       Neurological:     Mental Status: She is oriented to person, place, and time. Mental status is at baseline.  Psychiatric:        Mood and Affect: Mood normal.        Behavior: Behavior normal.      Depression Screen PHQ 2/9 Scores 10/30/2018 07/30/2017 04/08/2015  PHQ - 2 Score 1 2 0  PHQ- 9 Score 6 8 1        Assessment & Plan:     Routine Health Maintenance and Physical Exam  Exercise Activities and Dietary recommendations Goals   None     Immunization History  Administered Date(s) Administered  . Influenza, Seasonal, Injecte, Preservative Fre 07/23/2013  . Influenza,inj,Quad PF,6+ Mos 10/02/2018  . Td 07/30/2017  . Tdap 09/26/2006, 07/23/2013    Health Maintenance  Topic Date Due  . HIV Screening  07/19/1985  . PAP SMEAR-Modifier  08/01/2019  . TETANUS/TDAP  07/31/2027  . INFLUENZA VACCINE  Completed     Discussed health benefits of physical activity, and encouraged her to engage in regular exercise appropriate for her age and condition.   1. Annual physical exam   2. Thyroid disorder screening  - TSH  3. Lipid screening  - Lipid Profile  4. Screening for deficiency anemia  - CBC with Differential  5. Encounter for screening for HIV  - HIV antibody (with reflex)  6. Diabetes mellitus screening  - Comprehensive Metabolic Panel (CMET)  7. Rash  Appears like it could be healing herpetic lesions. Offered valtrex, patient is more concerned about itching. Will give cream as below.   - triamcinolone cream (KENALOG) 0.1 %; Apply 1 application topically 2  (two) times daily.  Dispense: 30 g; Refill: 0   The entirety of the information documented in the History of Present Illness, Review of Systems and Physical Exam were personally obtained by me. Portions of this information were initially documented by Lynford Humphrey, CMA and reviewed by me for thoroughness and accuracy.  Return in about 1 year (around 10/31/2019) for cpe.  --------------------------------------------------------------------    Trinna Post, PA-C  [place]

## 2018-10-30 NOTE — Patient Instructions (Signed)

## 2018-10-31 ENCOUNTER — Telehealth: Payer: Self-pay

## 2018-10-31 LAB — CBC WITH DIFFERENTIAL/PLATELET
Basophils Absolute: 0.1 10*3/uL (ref 0.0–0.2)
Basos: 1 %
EOS (ABSOLUTE): 0.1 10*3/uL (ref 0.0–0.4)
Eos: 2 %
Hematocrit: 39.5 % (ref 34.0–46.6)
Hemoglobin: 13.3 g/dL (ref 11.1–15.9)
Immature Grans (Abs): 0 10*3/uL (ref 0.0–0.1)
Immature Granulocytes: 0 %
Lymphocytes Absolute: 1.9 10*3/uL (ref 0.7–3.1)
Lymphs: 29 %
MCH: 29 pg (ref 26.6–33.0)
MCHC: 33.7 g/dL (ref 31.5–35.7)
MCV: 86 fL (ref 79–97)
Monocytes Absolute: 0.4 10*3/uL (ref 0.1–0.9)
Monocytes: 7 %
Neutrophils Absolute: 3.9 10*3/uL (ref 1.4–7.0)
Neutrophils: 61 %
Platelets: 359 10*3/uL (ref 150–450)
RBC: 4.59 x10E6/uL (ref 3.77–5.28)
RDW: 13 % (ref 11.7–15.4)
WBC: 6.4 10*3/uL (ref 3.4–10.8)

## 2018-10-31 LAB — LIPID PANEL
Chol/HDL Ratio: 4.4 ratio (ref 0.0–4.4)
Cholesterol, Total: 223 mg/dL — ABNORMAL HIGH (ref 100–199)
HDL: 51 mg/dL (ref >39)
LDL Calculated: 160 mg/dL — ABNORMAL HIGH (ref 0–99)
Triglycerides: 58 mg/dL (ref 0–149)
VLDL Cholesterol Cal: 12 mg/dL (ref 5–40)

## 2018-10-31 LAB — COMPREHENSIVE METABOLIC PANEL
ALT: 15 IU/L (ref 0–32)
AST: 18 IU/L (ref 0–40)
Albumin/Globulin Ratio: 1.8 (ref 1.2–2.2)
Albumin: 4.2 g/dL (ref 3.8–4.8)
Alkaline Phosphatase: 55 IU/L (ref 39–117)
BUN/Creatinine Ratio: 12 (ref 9–23)
BUN: 9 mg/dL (ref 6–24)
Bilirubin Total: 0.4 mg/dL (ref 0.0–1.2)
CO2: 23 mmol/L (ref 20–29)
Calcium: 9.3 mg/dL (ref 8.7–10.2)
Chloride: 104 mmol/L (ref 96–106)
Creatinine, Ser: 0.76 mg/dL (ref 0.57–1.00)
GFR calc Af Amer: 107 mL/min/{1.73_m2} (ref >59)
GFR calc non Af Amer: 93 mL/min/{1.73_m2} (ref >59)
Globulin, Total: 2.3 g/dL (ref 1.5–4.5)
Glucose: 88 mg/dL (ref 65–99)
Potassium: 4.5 mmol/L (ref 3.5–5.2)
Sodium: 140 mmol/L (ref 134–144)
Total Protein: 6.5 g/dL (ref 6.0–8.5)

## 2018-10-31 LAB — HIV ANTIBODY (ROUTINE TESTING W REFLEX): HIV Screen 4th Generation wRfx: NONREACTIVE

## 2018-10-31 LAB — TSH: TSH: 2.01 u[IU]/mL (ref 0.450–4.500)

## 2018-10-31 NOTE — Telephone Encounter (Signed)
-----   Message from Trinna Post, [place] sent at 10/31/2018  2:57 PM EST ----- Cholesterol somewhat elevated, LDL is high. Recommend reducing saturate fats, limiting sugars, and increasing exercise. Does not require Rx treatment. Remaining lab work normal.

## 2018-10-31 NOTE — Telephone Encounter (Signed)
Patient advised as below.  

## 2019-08-10 IMAGING — CR DG ABDOMEN 2V
1 series · 3 of 3 positions shown · non-contrast
Comparison: No recent prior.

CLINICAL DATA: Bloating.  Low back pain.

EXAM:
ABDOMEN - 2 VIEW

[Series 1: dg abd 2 views · 0.14mm/px · 3 of 3 slices shown]
[im 1/3]
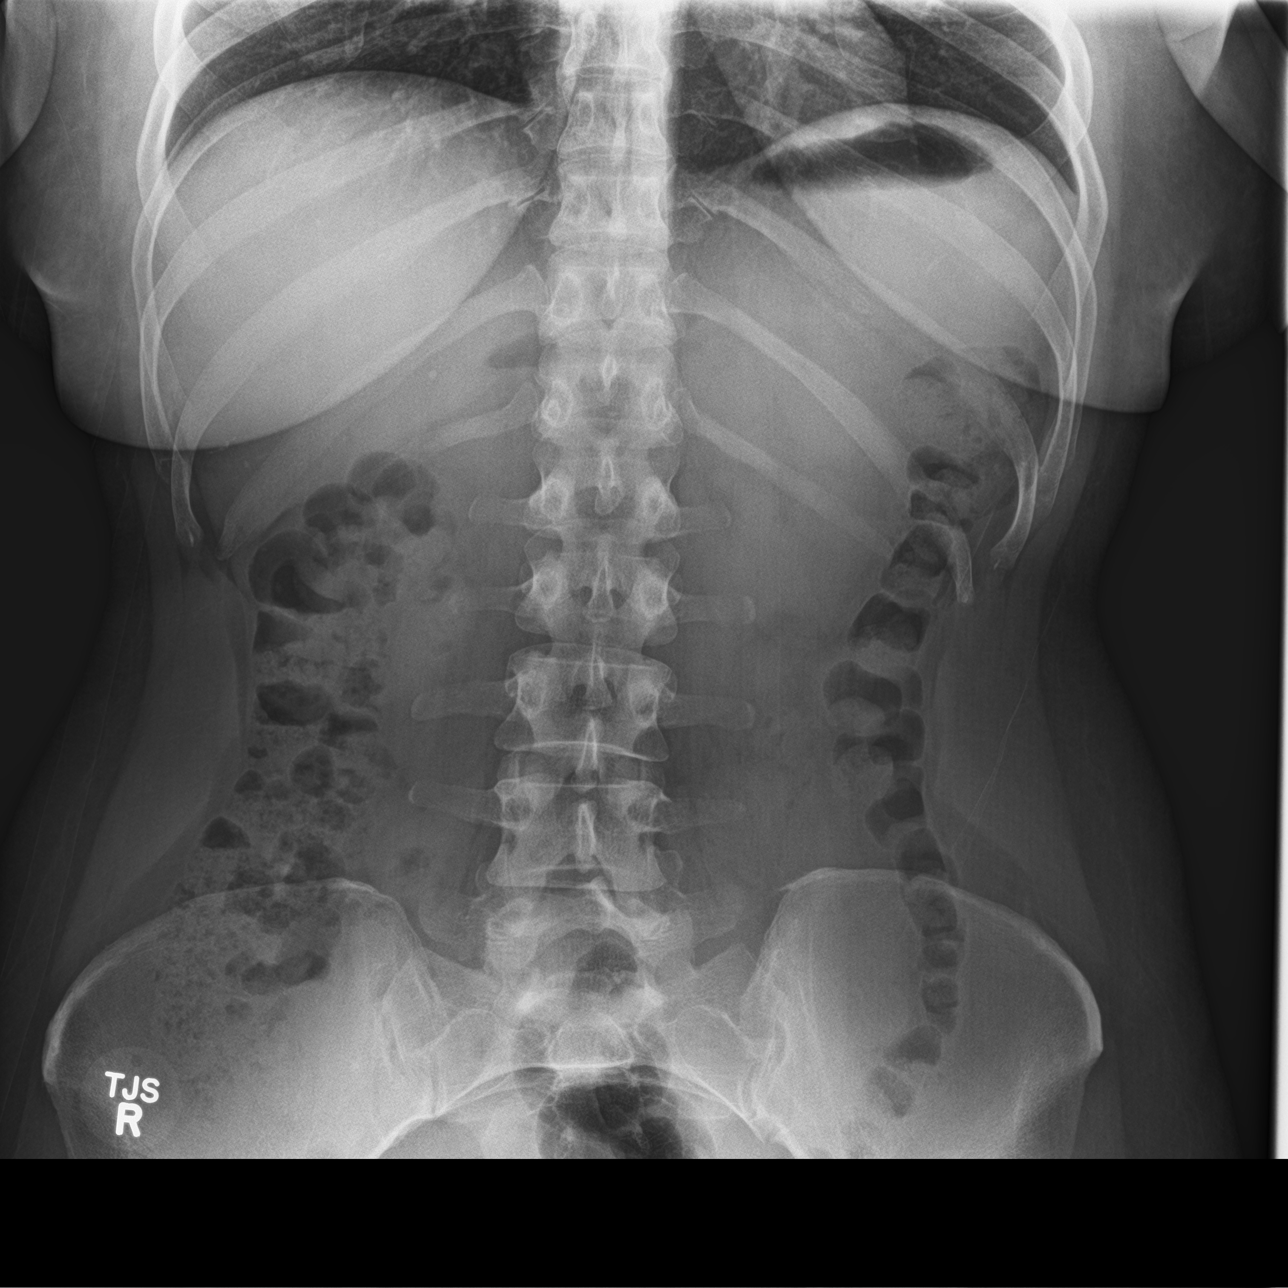
[im 2/3]
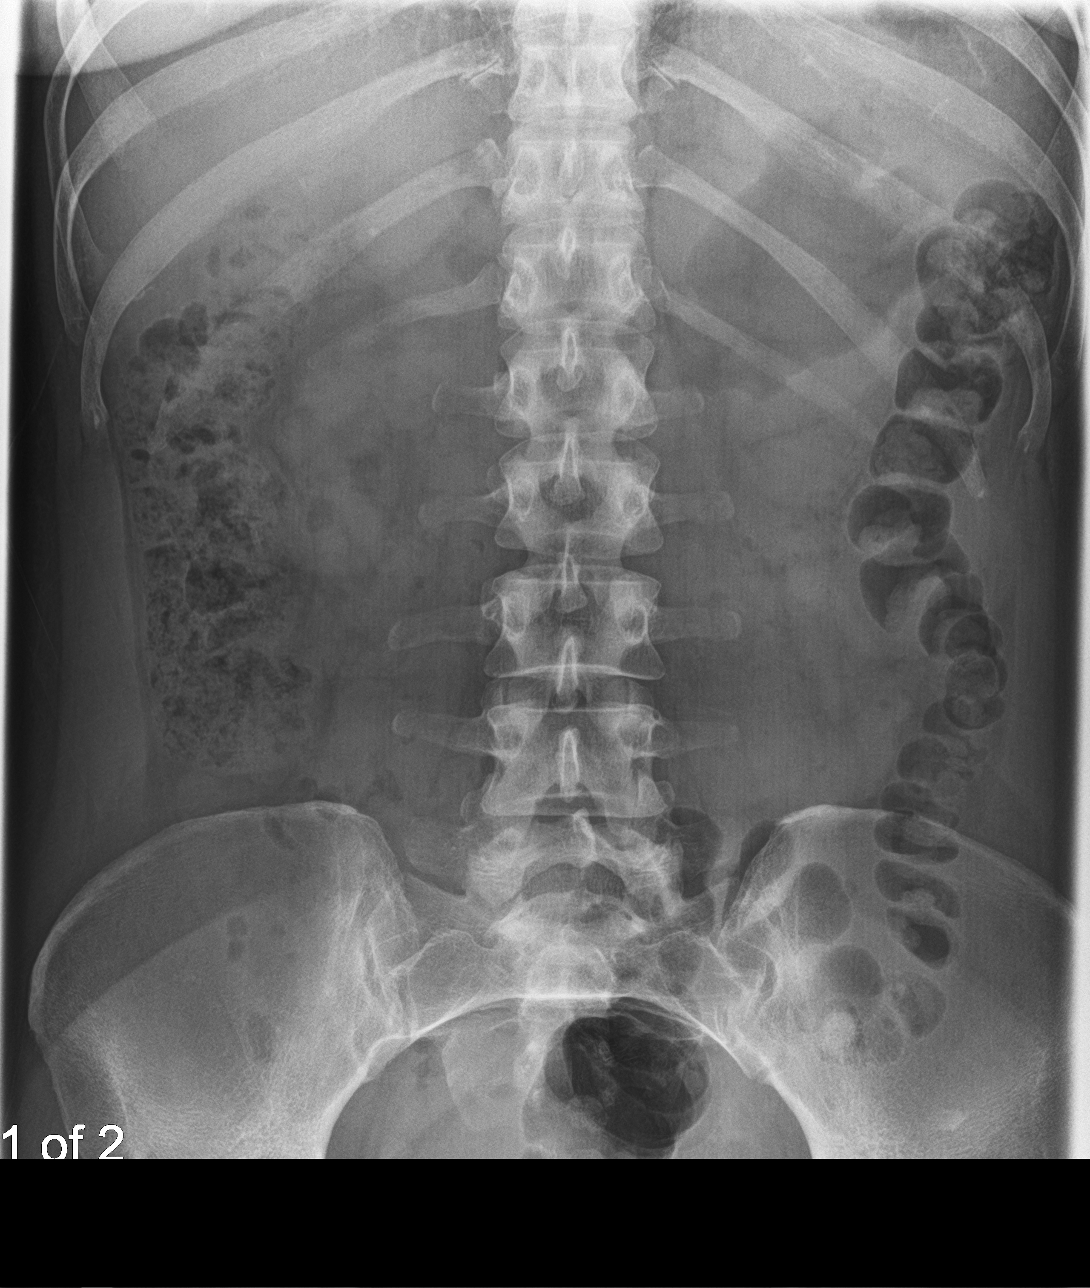
[im 3/3]
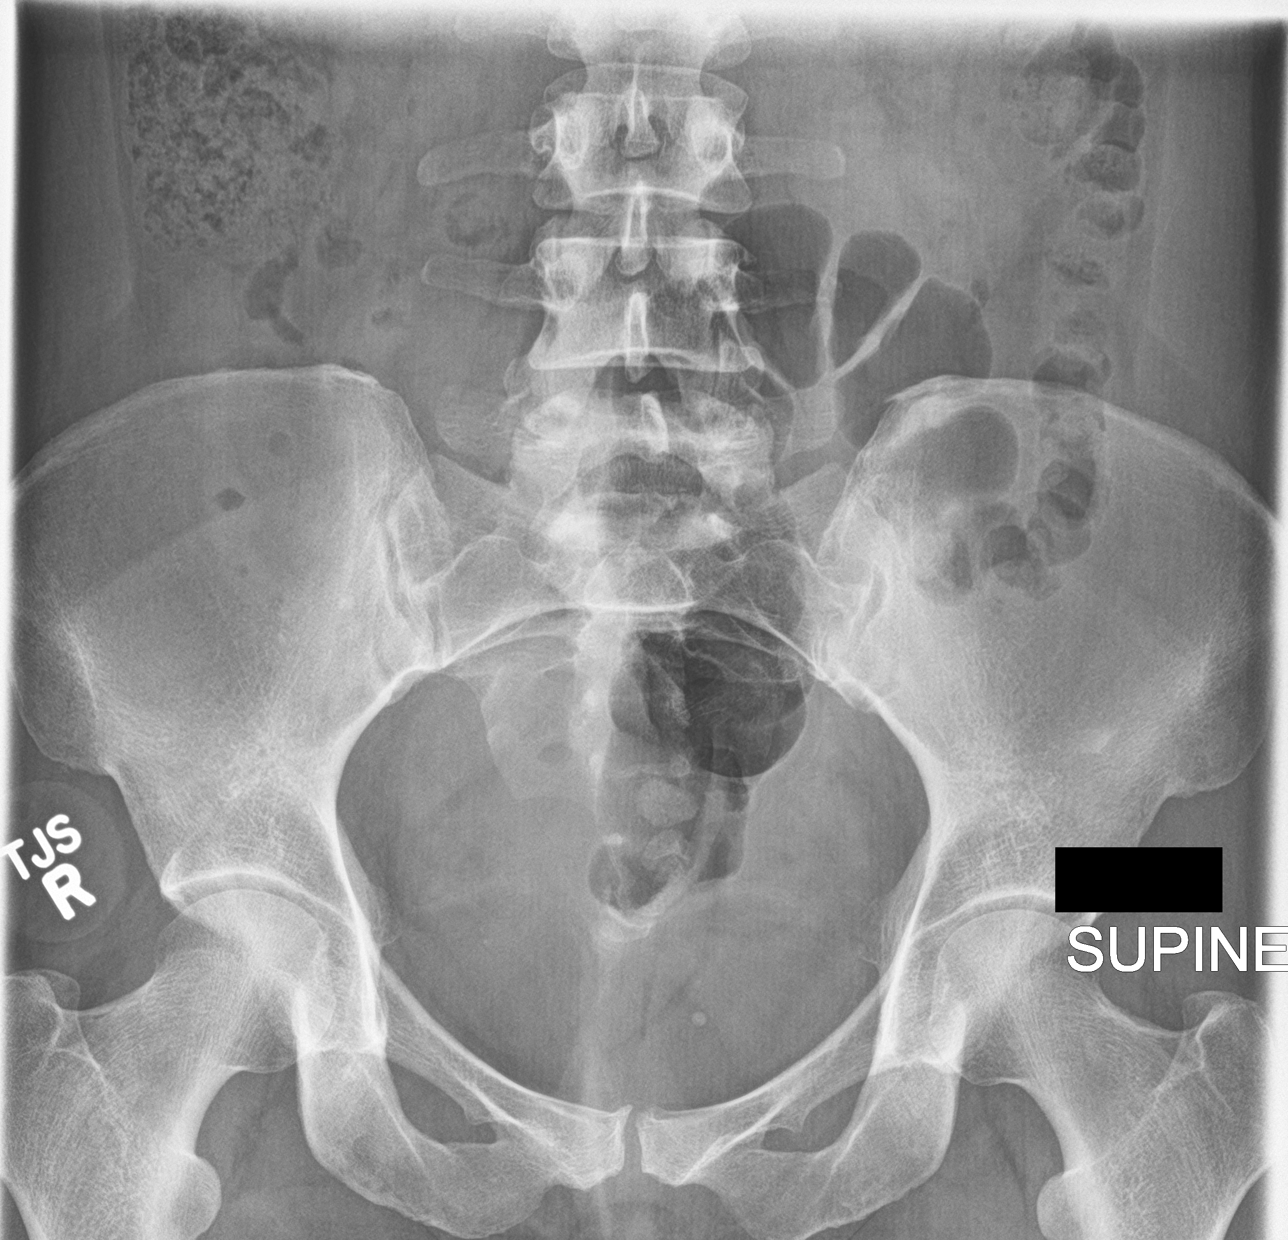

[3 of 3 positions shown; findings below may reference images not displayed]

FINDINGS: Soft tissue structures are unremarkable. Small calcification noted
over the right upper quadrant. This could represent a small
gallstone. No acute abnormality identified. No bowel distention or
free air. Moderate stool volume noted throughout the colon.
IMPRESSION: Small calcification noted in the right upper quadrant. This could
represent a small gallstone. No acute abnormality identified.
Moderate stool volume noted throughout the colon .

## 2019-09-18 ENCOUNTER — Ambulatory Visit: Payer: Managed Care, Other (non HMO) | Admitting: Family Medicine

## 2019-09-25 ENCOUNTER — Ambulatory Visit: Payer: Managed Care, Other (non HMO) | Admitting: Family Medicine

## 2019-09-25 ENCOUNTER — Ambulatory Visit
Admission: RE | Admit: 2019-09-25 | Discharge: 2019-09-25 | Disposition: A | Discharge status: Home / Self Care | Payer: Managed Care, Other (non HMO) | Source: Ambulatory Visit | Attending: Family Medicine | Admitting: Family Medicine

## 2019-09-25 ENCOUNTER — Encounter: Payer: Self-pay | Admitting: Family Medicine

## 2019-09-25 ENCOUNTER — Other Ambulatory Visit: Payer: Self-pay

## 2019-09-25 VITALS — BP 110/62 | HR 84 | Temp 96.9°F | Resp 16 | Wt 159.0 lb

## 2019-09-25 DIAGNOSIS — E559 Vitamin D deficiency, unspecified: Secondary | ICD-10-CM | POA: Diagnosis not present

## 2019-09-25 DIAGNOSIS — M5412 Radiculopathy, cervical region: Secondary | ICD-10-CM

## 2019-09-25 DIAGNOSIS — M542 Cervicalgia: Secondary | ICD-10-CM | POA: Diagnosis not present

## 2019-09-25 DIAGNOSIS — E7800 Pure hypercholesterolemia, unspecified: Secondary | ICD-10-CM | POA: Diagnosis not present

## 2019-09-25 MED ORDER — NAPROXEN 500 MG PO TABS: 500.0000 mg | ORAL_TABLET | Freq: Two times a day (BID) | ORAL | 0 refills | Status: DC

## 2019-09-25 NOTE — Progress Notes (Signed)
       Patient: Suzanne Sanchez Female    DOB: 1970-12-24   48 y.o.   MRN: [id number] Visit Date: 09/25/2019  Today's Provider: Lelon Huh, MD   Chief Complaint  Patient presents with  . Shoulder Pain    x 2 months   Subjective:     Shoulder Pain  The pain is present in the left shoulder. This is a new problem. Episode onset: 2 months ago. The problem occurs constantly. The problem has been unchanged. Associated symptoms include numbness and tingling (in left arm). Pertinent negatives include no fever or limited range of motion. Treatments tried: Aspirin. The treatment provided moderate relief.   She is also due for follow up lipids and vitamin d. She was previously prescribed atorvastatin which she stopped last year. She states she felt like it keeps her up at night and makes her need to urinate more frequently.    No Known Allergies  No current outpatient medications on file.  Review of Systems  Constitutional: Negative for fever.  Neurological: Positive for tingling (in left arm) and numbness.    Social History   Tobacco Use  . Smoking status: Never Smoker  . Smokeless tobacco: Never Used  Substance Use Topics  . Alcohol use: No    Alcohol/week: 0.0 standard drinks      Objective:   BP 110/62 (BP Location: Left Arm, Patient Position: Sitting, Cuff Size: Normal)   Pulse 84   Temp (!) 96.9 F (36.1 C) (Temporal)   Resp 16   Wt 159 lb (72.1 kg)   SpO2 99% Comment: room air  BMI 28.17 kg/m  Vitals:   09/25/19 1523  BP: 110/62  Pulse: 84  Resp: 16  Temp: (!) 96.9 F (36.1 C)  TempSrc: Temporal  SpO2: 99%  Weight: 159 lb (72.1 kg)  Body mass index is 28.17 kg/m.   Physical Exam   General Appearance:    Well developed, well nourished female in no acute distress  Eyes:    PERRL, conjunctiva/corneas clear, EOM's intact       Lungs:     Clear to auscultation bilaterally, respirations unlabored  Heart:    Normal heart rate. Normal rhythm. No  murmurs, rubs, or gallops.   MS:   All extremities are intact. FROM of arm and shoulder with on pain whatsoever. Mild pain on limits of neck flexion. Normal muscle strength  Neurologic:   Awake, alert, oriented x 3. No apparent focal neurological           defect.        No results found for any visits on 09/25/19.     Assessment & Plan    1. Neck pain   2. Cervical radiculopathy  - DG Cervical Spine Complete; Future - naproxen (NAPROSYN) 500 MG tablet; Take 1 tablet (500 mg total) by mouth 2 (two) times daily with a meal.  Dispense: 30 tablet; Refill: 0  3. Vitamin D deficiency  - Vitamin D (25 hydroxy)  4. Pure hypercholesterolemia Currently off of atorvastatin  - Comprehensive metabolic panel - Lipid panel (fasting)      Lelon Huh, MD  [facility]

## 2019-09-25 NOTE — Patient Instructions (Addendum)
.   Please review the attached list of medications and notify my office if there are any errors.   . Please bring all of your medications to every appointment so we can make sure that our medication list is the same as yours.    Apply ice pack to base of neck 2-3 times daily  . Please go to the lab draw station in [address] on the second floor of [facility]  when you are fasting for 8 hours. Normal hours are 8:00am to 12:30pm and 1:30pm to 4:00pm Monday through Friday  . Go to the [facility] on [facility] for neck Xray

## 2019-09-29 ENCOUNTER — Telehealth: Payer: Self-pay

## 2019-09-29 NOTE — Telephone Encounter (Signed)
Tried calling patient. No answer or voice  message system. Will try calling back later.  

## 2019-09-29 NOTE — Telephone Encounter (Signed)
-----   Message from Birdie Sons, MD sent at 09/28/2019  4:50 PM EST ----- Xrays do show arthritis which is probably causing some impingement of nerve going into arm and shoulder. Should improve with naprosyn. If not better by the end of the week, then will need a course of prednisone.

## 2019-09-30 LAB — COMPREHENSIVE METABOLIC PANEL
ALT: 47 IU/L — ABNORMAL HIGH (ref 0–32)
AST: 27 IU/L (ref 0–40)
Albumin/Globulin Ratio: 1.8 (ref 1.2–2.2)
Albumin: 4.2 g/dL (ref 3.8–4.8)
Alkaline Phosphatase: 73 IU/L (ref 39–117)
BUN/Creatinine Ratio: 10 (ref 9–23)
BUN: 8 mg/dL (ref 6–24)
Bilirubin Total: 0.3 mg/dL (ref 0.0–1.2)
CO2: 21 mmol/L (ref 20–29)
Calcium: 9.5 mg/dL (ref 8.7–10.2)
Chloride: 105 mmol/L (ref 96–106)
Creatinine, Ser: 0.83 mg/dL (ref 0.57–1.00)
GFR calc Af Amer: 96 mL/min/{1.73_m2} (ref >59)
GFR calc non Af Amer: 83 mL/min/{1.73_m2} (ref >59)
Globulin, Total: 2.3 g/dL (ref 1.5–4.5)
Glucose: 92 mg/dL (ref 65–99)
Potassium: 4.3 mmol/L (ref 3.5–5.2)
Sodium: 139 mmol/L (ref 134–144)
Total Protein: 6.5 g/dL (ref 6.0–8.5)

## 2019-09-30 LAB — LIPID PANEL
Chol/HDL Ratio: 4.4 ratio (ref 0.0–4.4)
Cholesterol, Total: 208 mg/dL — ABNORMAL HIGH (ref 100–199)
HDL: 47 mg/dL (ref >39)
LDL Chol Calc (NIH): 142 mg/dL — ABNORMAL HIGH (ref 0–99)
Triglycerides: 105 mg/dL (ref 0–149)
VLDL Cholesterol Cal: 19 mg/dL (ref 5–40)

## 2019-09-30 LAB — VITAMIN D 25 HYDROXY (VIT D DEFICIENCY, FRACTURES): Vit D, 25-Hydroxy: 32.2 ng/mL (ref 30.0–100.0)

## 2019-09-30 NOTE — Telephone Encounter (Signed)
Tried calling patient. Left message to call back. OK for [facility] triage nurse to advise patient.  

## 2019-10-01 NOTE — Telephone Encounter (Signed)
Patient advised of results. She reports her symptoms have improved since taking Naprosyn.

## 2020-08-03 ENCOUNTER — Encounter: Payer: Managed Care, Other (non HMO) | Admitting: Physician Assistant

## 2020-12-20 ENCOUNTER — Encounter: Payer: Self-pay | Admitting: Family Medicine

## 2020-12-20 ENCOUNTER — Ambulatory Visit: Payer: Managed Care, Other (non HMO) | Admitting: Family Medicine

## 2020-12-20 ENCOUNTER — Other Ambulatory Visit: Payer: Self-pay

## 2020-12-20 VITALS — BP 115/85 | HR 81 | Temp 97.9°F | Resp 16 | Ht 63.0 in | Wt 152.2 lb

## 2020-12-20 DIAGNOSIS — R102 Pelvic and perineal pain: Secondary | ICD-10-CM | POA: Diagnosis not present

## 2020-12-20 LAB — POCT URINALYSIS DIPSTICK
Bilirubin, UA: NEGATIVE
Blood, UA: NEGATIVE
Glucose, UA: NEGATIVE
Ketones, UA: NEGATIVE
Leukocytes, UA: NEGATIVE
Nitrite, UA: NEGATIVE
Protein, UA: NEGATIVE
Spec Grav, UA: <=1.005 — AB (ref 1.010–1.025)
Urobilinogen, UA: 0.2 E.U./dL
pH, UA: 7 (ref 5.0–8.0)

## 2020-12-20 NOTE — Progress Notes (Signed)
Established patient visit   Patient: Suzanne Sanchez   DOB: 03/07/1971   50 y.o. Female  MRN: [id number] Visit Date: 12/20/2020  Today's healthcare provider: Lelon Huh, MD   Chief Complaint  Patient presents with  . Abdominal Pain   Subjective    HPI  Patient here today C/O "inflammation of uterus" over a week ago, noticed more with menstruation last week. Patient reports pain while trying to insert a tampon. Patient reports regular flow, lasted about 7 days. Patient reports abdominal bloating now. Patient denies inflammation of uterus at this moment. Patient denies any painful urination or burning, fever, chills, abnormal vaginal discharge, vomiting or diarrhea. Patient reports she did have pain and noticed blood in her urine 4 weeks ago just once. Patient took OTC medication for UTI symptoms. Patient reports dizziness, and off balance, patient not sure if this is related or is just noticing symptoms more.   Patient Active Problem List   Diagnosis Date Noted  . Vitamin D deficiency 11/28/2017  . Sebaceous cyst 03/06/2016  . Pure hypercholesterolemia 04/07/2015   Social History   Tobacco Use  . Smoking status: Never Smoker  . Smokeless tobacco: Never Used  Substance Use Topics  . Alcohol use: No    Alcohol/week: 0.0 standard drinks  . Drug use: No   Family History  Adopted: Yes   No Known Allergies     Medications: Outpatient Medications Prior to Visit  Medication Sig  . [DISCONTINUED] naproxen (NAPROSYN) 500 MG tablet Take 1 tablet (500 mg total) by mouth 2 (two) times daily with a meal.   No facility-administered medications prior to visit.    Review of Systems  Constitutional: Negative for activity change, appetite change, diaphoresis and fever.  Respiratory: Negative for chest tightness and shortness of breath.   Cardiovascular: Negative for chest pain.  Gastrointestinal: Positive for abdominal distention. Negative for abdominal pain, constipation,  diarrhea, nausea and vomiting.  Genitourinary: Negative for difficulty urinating, dysuria, flank pain, menstrual problem, pelvic pain, vaginal bleeding, vaginal discharge and vaginal pain.  Neurological: Positive for dizziness.    Last CBC Lab Results  Component Value Date   WBC 6.4 10/30/2018   HGB 13.3 10/30/2018   HCT 39.5 10/30/2018   MCV 86 10/30/2018   MCH 29.0 10/30/2018   RDW 13.0 10/30/2018   PLT 359 54/00/8676   Last metabolic panel Lab Results  Component Value Date   GLUCOSE 92 09/29/2019   NA 139 09/29/2019   K 4.3 09/29/2019   CL 105 09/29/2019   CO2 21 09/29/2019   BUN 8 09/29/2019   CREATININE 0.83 09/29/2019   GFRNONAA 83 09/29/2019   GFRAA 96 09/29/2019   CALCIUM 9.5 09/29/2019   PROT 6.5 09/29/2019   ALBUMIN 4.2 09/29/2019   LABGLOB 2.3 09/29/2019   AGRATIO 1.8 09/29/2019   BILITOT 0.3 09/29/2019   ALKPHOS 73 09/29/2019   AST 27 09/29/2019   ALT 47 (H) 09/29/2019   Last lipids Lab Results  Component Value Date   CHOL 208 (H) 09/29/2019   HDL 47 09/29/2019   LDLCALC 142 (H) 09/29/2019   TRIG 105 09/29/2019   CHOLHDL 4.4 09/29/2019        Objective    BP 115/85 (BP Location: Right Arm, Patient Position: Sitting, Cuff Size: Normal)   Pulse 81   Temp 97.9 F (36.6 C) (Temporal)   Resp 16   Ht 5\' 3"  (1.6 m)   Wt 152 lb 3.2 oz (69 kg)   LMP  12/06/2020 (Approximate)   SpO2 100%   BMI 26.96 kg/m  BP Readings from Last 3 Encounters:  12/20/20 115/85  09/25/19 110/62  10/30/18 115/80   Wt Readings from Last 3 Encounters:  12/20/20 152 lb 3.2 oz (69 kg)  09/25/19 159 lb (72.1 kg)  10/30/18 160 lb (72.6 kg)      Physical Exam  General Appearance:     Well developed, well nourished female, alert, cooperative, in no acute distress  Eyes:    PERRL, conjunctiva/corneas clear, EOM's intact       Lungs:     Clear to auscultation bilaterally, respirations unlabored  Heart:    . Normal rhythm. No murmurs, rubs, or gallops.   Abdomen:     Moderately distended lower abdomen.      Results for orders placed or performed in visit on 12/20/20  POCT urinalysis dipstick  Result Value Ref Range   Color, UA yellow    Clarity, UA clear    Glucose, UA Negative Negative   Bilirubin, UA Negative    Ketones, UA Negative    Spec Grav, UA <=1.005 (A) 1.010 - 1.025   Blood, UA Negative    pH, UA 7.0 5.0 - 8.0   Protein, UA Negative Negative   Urobilinogen, UA 0.2 0.2 or 1.0 E.U./dL   Nitrite, UA Negative    Leukocytes, UA Negative Negative    Assessment & Plan     1. Pelvic pain/swelling/bloating.   - US Pelvis Complete; Future   She is scheduled for CPE next month and will defer pap/pelvic until then unless referred to Gyn       The entirety of the information documented in the History of Present Illness, Review of Systems and Physical Exam were personally obtained by me. Portions of this information were initially documented by the CMA and reviewed by me for thoroughness and accuracy.      Lelon Huh, MD  [facility] [phone] (phone) [phone] (fax)  [place]

## 2021-01-04 ENCOUNTER — Telehealth: Payer: Self-pay | Admitting: Family Medicine

## 2021-01-04 NOTE — Telephone Encounter (Signed)
Yes

## 2021-01-04 NOTE — Telephone Encounter (Addendum)
Parker with [facility] in [place]  is calling the pt is sch for US Pelvic complete on 01-10-2021.[facility] is calling to ask if md would like pt to have transvaginal ultrasound as well.

## 2021-01-10 ENCOUNTER — Ambulatory Visit
Admission: RE | Admit: 2021-01-10 | Discharge: 2021-01-10 | Disposition: A | Discharge status: Home / Self Care | Payer: Managed Care, Other (non HMO) | Source: Ambulatory Visit | Attending: Family Medicine | Admitting: Family Medicine

## 2021-01-10 ENCOUNTER — Other Ambulatory Visit: Payer: Self-pay

## 2021-01-10 DIAGNOSIS — R102 Pelvic and perineal pain: Secondary | ICD-10-CM | POA: Insufficient documentation

## 2021-01-13 ENCOUNTER — Encounter: Payer: Self-pay | Admitting: Family Medicine

## 2021-01-13 ENCOUNTER — Telehealth: Payer: Self-pay

## 2021-01-13 DIAGNOSIS — D219 Benign neoplasm of connective and other soft tissue, unspecified: Secondary | ICD-10-CM

## 2021-01-13 NOTE — Telephone Encounter (Signed)
Suzanne Sanchez, CMA  01/13/2021 2:29 PM EDT      Called and left Vm for patient to call back about [place] results. Asked her to call the office to discuss next steps. [facility] may give results if patient calls back.    Birdie Sons, MD  01/13/2021 12:43 PM EDT      Ultrasound shows multiple uterine fibroids which are probably contributing to her symptoms, recommend Gyn referral.

## 2021-01-13 NOTE — Telephone Encounter (Signed)
Patient advised of message below and agrees to referral. Please schedule appointment.

## 2021-01-16 NOTE — Telephone Encounter (Signed)
[facility] referring for uterine fibroids. Called and left voicemail for patient to call back to be scheduled.

## 2021-01-16 NOTE — Telephone Encounter (Signed)
Pt. Called back, scheduled appt. for 5/17 at 8:10 with SDJ.

## 2021-01-24 ENCOUNTER — Other Ambulatory Visit (HOSPITAL_COMMUNITY)
Admission: RE | Admit: 2021-01-24 | Discharge: 2021-01-24 | Disposition: A | Discharge status: Home / Self Care | Payer: Managed Care, Other (non HMO) | Source: Ambulatory Visit | Attending: Obstetrics and Gynecology | Admitting: Obstetrics and Gynecology

## 2021-01-24 ENCOUNTER — Ambulatory Visit (INDEPENDENT_AMBULATORY_CARE_PROVIDER_SITE_OTHER): Payer: Managed Care, Other (non HMO) | Admitting: Obstetrics and Gynecology

## 2021-01-24 ENCOUNTER — Encounter: Payer: Self-pay | Admitting: Obstetrics and Gynecology

## 2021-01-24 ENCOUNTER — Other Ambulatory Visit: Payer: Self-pay

## 2021-01-24 VITALS — BP 120/80 | Ht 63.0 in | Wt 153.0 lb

## 2021-01-24 DIAGNOSIS — Z1151 Encounter for screening for human papillomavirus (HPV): Secondary | ICD-10-CM | POA: Insufficient documentation

## 2021-01-24 DIAGNOSIS — Z124 Encounter for screening for malignant neoplasm of cervix: Secondary | ICD-10-CM

## 2021-01-24 DIAGNOSIS — N841 Polyp of cervix uteri: Secondary | ICD-10-CM | POA: Diagnosis not present

## 2021-01-24 DIAGNOSIS — R1031 Right lower quadrant pain: Secondary | ICD-10-CM

## 2021-01-24 DIAGNOSIS — D252 Subserosal leiomyoma of uterus: Secondary | ICD-10-CM

## 2021-01-24 DIAGNOSIS — D251 Intramural leiomyoma of uterus: Secondary | ICD-10-CM | POA: Diagnosis not present

## 2021-01-24 NOTE — Progress Notes (Signed)
Obstetrics & Gynecology Office Visit   Chief Complaint  Patient presents with  . Fibroids   The patient is seen in referral at the request of Birdie Sons, MD from [facility] for uterine fibroids.   History of Present Illness: 50 y.o. G2P2 female who is seen in referral from Birdie Sons, MD from [facility] for uterine fibroids.  She has symptoms of pressure and pain.  She has pressure when trying to place a tampon.  She has regular periods, coming monthly (every 30-35 days), lasting 7-10 days.   She also has abdominal pain. She can feel a bulge on her right side.  She notes this pressure started about 2 months ago. She occasionally has pain, it occurs infrequently and feels like an electrical current.  She notes the vaginal pressure all the time.    Last pap smear: 07/2016, NILM, HPV neg  Ultrasound report 01/10/2021: FINDINGS: Uterus  Measurements: 12.4 cm x 7.6 cm x 6.8 cm = volume: 333 mL. 4.6 cm x 3.8 cm x 4.7 cm and 6.2 cm x 6.2 cm and 7.1 cm heterogeneous uterine fibroids are seen. An additional 2.3 cm x 1.8 cm x 2.1 cm uterine fibroid is noted.  Endometrium  Thickness: 10.0 mm.  No focal abnormality visualized.  Right ovary  The right ovary is not clearly identified.  Left ovary  Measurements: 3.1 cm x 1.2 cm x 2.0 cm = volume: 3.6 mL. A 1.2 cm diameter anechoic structure is seen within the left ovary. No abnormal flow is noted within this region on color Doppler evaluation.  Other findings:  No abnormal free fluid.  IMPRESSION: 1. Multiple heterogeneous uterine fibroids. 2. Small left ovarian follicle/cyst. 3. Nonvisualization of the right ovary.  Past Medical History:  Diagnosis Date  . Anxiety   . History of chicken pox   . Hyperlipidemia   . Insomnia     Past Surgical History:  Procedure Laterality Date  . TUBAL LIGATION  1993    Gynecologic History: Patient's last menstrual period was  01/13/2021.  Obstetric History: G2P2  Family History  Adopted: Yes    Social History   Socioeconomic History  . Marital status: Married    Spouse name: Not on file  . Number of children: 2  . Years of education: college  . Highest education level: Not on file  Occupational History  . 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 Smoking status: Never Smoker  . Smokeless tobacco: Never Used  Substance and Sexual Activity  . Alcohol use: No    Alcohol/week: 0.0 standard drinks  . Drug use: No  . Sexual activity: Yes  Other Topics Concern  . Not on file  Social History Narrative  . Not on file   Social Determinants of Health   Financial Resource Strain: Not on file  Food Insecurity: Not on file  Transportation Needs: Not on file  Physical Activity: Not on file  Stress: Not on file  Social Connections: Not on file  Intimate Partner Violence: Not on file   Allergies:No Known Allergies  Prior to Admission medications   Denies   Review of Systems  Constitutional: Negative.   HENT: Negative.   Eyes: Negative.   Respiratory: Negative.   Cardiovascular: Negative.   Gastrointestinal: Positive for abdominal pain and constipation. Negative for blood in stool, diarrhea, heartburn, melena, nausea and vomiting.  Genitourinary: Negative.   Musculoskeletal: Negative.  Skin: Negative.   Neurological: Negative.   Psychiatric/Behavioral: Negative.      Physical Exam BP 120/80   Ht 5\' 3"  (1.6 m)   Wt 153 lb (69.4 kg)   LMP 01/13/2021   BMI 27.10 kg/m  Patient's last menstrual period was 01/13/2021. Physical Exam Constitutional:      General: She is not in acute distress.    Appearance: Normal appearance. She is well-developed.  Genitourinary:     Vulva, bladder and urethral meatus normal.     No lesions in the vagina.     Right Labia: No rash, tenderness, lesions, skin changes or Bartholin's  cyst.    Left Labia: No tenderness, lesions, skin changes, Bartholin's cyst or rash.    No inguinal adenopathy present in the right or left side.    Pelvic Tanner Score: 5/5.    No vaginal erythema, tenderness, bleeding or ulceration.     No vaginal prolapse present.    No vaginal atrophy present.     Right Adnexa: full.    Right Adnexa: not tender and no mass present.    Left Adnexa: not tender, not full and no mass present.    No cervical motion tenderness, friability, lesion or polyp.     Uterus is enlarged and irregular.     Uterus is not fixed or tender.     Uterine mass present.    Uterus exam comments: Mobile mass that feels nearly separate from uterus on right side. Size consistent with u/s finding of fibroid.  Feels exophytic and perhaps as if it is on a stalk with the mobility of the mass. This could also represent a large ovarian cyst. However, this would be unusual given the recent pelvic ultrasound. .     Uterus is anteverted.     No urethral tenderness or mass present.     Pelvic exam was performed with patient in the lithotomy position.  HENT:     Head: Normocephalic and atraumatic.  Eyes:     General: No scleral icterus.    Conjunctiva/sclera: Conjunctivae normal.  Cardiovascular:     Rate and Rhythm: Normal rate and regular rhythm.     Heart sounds: No murmur heard. No friction rub. No gallop.   Pulmonary:     Effort: Pulmonary effort is normal. No respiratory distress.     Breath sounds: Normal breath sounds. No wheezing or rales.  Abdominal:     General: Bowel sounds are normal. There is no distension.     Palpations: Abdomen is soft. There is no mass.     Tenderness: There is no abdominal tenderness. There is no guarding or rebound.     Hernia: There is no hernia in the left inguinal area or right inguinal area.  Musculoskeletal:        General: Normal range of motion.     Cervical back: Normal range of motion and neck supple.  Lymphadenopathy:     Lower  Body: No right inguinal adenopathy. No left inguinal adenopathy.  Neurological:     General: No focal deficit present.     Mental Status: She is alert and oriented to person, place, and time.     Cranial Nerves: No cranial nerve deficit.  Skin:    General: Skin is warm and dry.     Findings: No erythema.  Psychiatric:        Mood and Affect: Mood normal.        Behavior: Behavior normal.  Judgment: Judgment normal.   Procedure -cervical polyp removal After discussing the need to remove the cervical polyp the patient agreed to proceed.  The patient was already in dorsal supine lithotomy position with the speculum in place.  The polyp was extending from the endocervical canal to the external cervical os.  The visible portion of the polyp and extending a little ways into the cervical canal was grasped with the ring forceps.  A clockwise rotation of the forceps with 12 rotations resulted in the removal of the polyp.  There was some mild bleeding which was treated with silver nitrate.  The tissue was placed in formalin.  The patient tolerated the procedure well.  Female chaperone present for pelvic and breast  portions of the physical exam  Assessment: 50 y.o. G2P2 female here for  1. Intramural and subserous leiomyoma of uterus   2. Pap smear for cervical cancer screening   3. Right lower quadrant abdominal pain   4. Cervical polyp      Plan: Problem List Items Addressed This Visit   None   Visit Diagnoses    Intramural and subserous leiomyoma of uterus    -  Primary   Pap smear for cervical cancer screening       Relevant Orders   Cytology - PAP   Right lower quadrant abdominal pain       Cervical polyp       Relevant Orders   Surgical pathology     Discussed all options. She elects to monitor with u/s in 6 months. Sooner if symptoms worsen. DOes not seem interested in UFE.   Cervical polypectomy performed. Pap smear performed. I encouraged the patient to get a mammogram  and colonoscopy.   A total of 35 minutes were spent face-to-face with the patient as well as preparation, review, communication, and documentation during this encounter.    Prentice Docker, MD 01/24/2021 9:11 AM    CC: Birdie Sons, MD [address] [place],  [place] [address]

## 2021-01-25 LAB — SURGICAL PATHOLOGY

## 2021-01-27 ENCOUNTER — Encounter: Payer: Managed Care, Other (non HMO) | Admitting: Family Medicine

## 2021-01-27 NOTE — Progress Notes (Deleted)
Complete physical exam   Patient: Suzanne Sanchez   DOB: [birth year]   50 y.o. Female  MRN: [id number] Visit Date: 01/27/2021  Today's healthcare provider: Lelon Huh, MD   No chief complaint on file.  Subjective    [name] is a 50 y.o. female who presents today for a complete physical exam.  She reports consuming a {diet types:17450} diet. {Exercise:19826} She generally feels {well/fairly well/poorly:18703}. She reports sleeping {well/fairly well/poorly:18703}. She {does/does not:200015} have additional problems to discuss today.  HPI  Lipid/Cholesterol, Follow-up  Last lipid panel Other pertinent labs  Lab Results  Component Value Date   CHOL 208 (H) 09/29/2019   HDL 47 09/29/2019   LDLCALC 142 (H) 09/29/2019   TRIG 105 09/29/2019   CHOLHDL 4.4 09/29/2019   Lab Results  Component Value Date   ALT 47 (H) 09/29/2019   AST 27 09/29/2019   PLT 359 10/30/2018   TSH 2.010 10/30/2018     She was last seen for this 1 year ago.  Management since that visit includes advising patient to cut back on saturated fats in her diet.  She reports {excellent/good/fair/poor:19665} compliance with treatment. She {is/is not:9024} having side effects. {document side effects if present:1}  Symptoms: {Yes/No:20286} chest pain {Yes/No:20286} chest pressure/discomfort  {Yes/No:20286} dyspnea {Yes/No:20286} lower extremity edema  {Yes/No:20286} numbness or tingling of extremity {Yes/No:20286} orthopnea  {Yes/No:20286} palpitations {Yes/No:20286} paroxysmal nocturnal dyspnea  {Yes/No:20286} speech difficulty {Yes/No:20286} syncope   Current diet: {diet habits:16563} Current exercise: {exercise types:16438}  The 10-year ASCVD risk score Mikey Bussing DC Jr., et al., 2013) is: 1.4%  ---------------------------------------------------------------------------------------------------  Vitamin D deficiency, follow-up  Lab Results  Component Value Date   VD25OH 32.2 09/29/2019    VD25OH 27.2 (L) 11/28/2017   VD25OH 7.8 (L) 10/08/2016   CALCIUM 9.5 09/29/2019   CALCIUM 9.3 10/30/2018  ) Wt Readings from Last 3 Encounters:  01/24/21 153 lb (69.4 kg)  12/20/20 152 lb 3.2 oz (69 kg)  09/25/19 159 lb (72.1 kg)    She was last seen for vitamin D deficiency 1 year ago.  Patient is not currently taking any Vitamin D supplements. She reports {excellent/good/fair/poor:19665} compliance with treatment. She {is/is not:21021397} having side effects. {document side effects if present:1}  Symptoms: {Yes/No:20286} change in energy level {Yes/No:20286} numbness or tingling  {Yes/No:20286} bone pain {Yes/No:20286} unexplained fracture   ---------------------------------------------------------------------------------------------------  Past Medical History:  Diagnosis Date  . Anxiety   . History of chicken pox   . Hyperlipidemia   . Insomnia    Past Surgical History:  Procedure Laterality Date  . TUBAL LIGATION  1993   Social History   Socioeconomic History  . Marital status: Married    Spouse name: Not on file  . Number of children: 2  . Years of education: college  . Highest education level: Not on file  Occupational History  . 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 Smoking status: Never Smoker  . Smokeless tobacco: Never Used  Substance and Sexual Activity  . Alcohol use: No    Alcohol/week: 0.0 standard drinks  . Drug use: No  . Sexual activity: Yes  Other Topics Concern  . Not on file  Social History Narrative  . Not on file   Social Determinants of Health   Financial Resource Strain: Not on file  Food Insecurity: Not on file  Transportation Needs: Not on file  Physical Activity: Not on file  Stress:  Not on file  Social Connections: Not on file  Intimate Partner Violence: Not on file   No family status information on file.   Family History  Adopted: Yes   No  Known Allergies  Patient Care Team: Birdie Sons, MD as PCP - General (Family Medicine) Caryn Section Kirstie Peri, MD as Referring Physician (Family Medicine) Bary Castilla Forest Gleason, MD (General Surgery)   Medications: No outpatient medications prior to visit.   No facility-administered medications prior to visit.    Review of Systems  {Labs  Heme  Chem  Endocrine  Serology  Results Review (optional):23779::" "}  Objective    LMP 01/13/2021  {Show previous vital signs (optional):23777::" "}  Physical Exam  ***  Last depression screening scores PHQ 2/9 Scores 12/20/2020 10/30/2018 07/30/2017  PHQ - 2 Score 0 1 2  PHQ- 9 Score 1 6 8    Last fall risk screening Fall Risk  12/20/2020  Falls in the past year? 0  Number falls in past yr: 0  Injury with Fall? 0  Risk for fall due to : No Fall Risks  Follow up Falls evaluation completed   Last Audit-C alcohol use screening Alcohol Use Disorder Test (AUDIT) 12/20/2020  1. How often do you have a drink containing alcohol? 0  2. How many drinks containing alcohol do you have on a typical day when you are drinking? 0  3. How often do you have six or more drinks on one occasion? 0  AUDIT-C Score 0   A score of 3 or more in women, and 4 or more in men indicates increased risk for alcohol abuse, EXCEPT if all of the points are from question 1   No results found for any visits on 01/27/21.  Assessment & Plan    Routine Health Maintenance and Physical Exam  Exercise Activities and Dietary recommendations Goals   None     Immunization History  Administered Date(s) Administered  . Influenza, Seasonal, Injecte, Preservative Fre 07/23/2013  . Influenza,inj,Quad PF,6+ Mos 10/02/2018  . Moderna Sars-Covid-2 Vaccination 09/23/2019, 10/21/2019, 07/06/2020  . Td 07/30/2017  . Tdap 09/26/2006, 07/23/2013    Health Maintenance  Topic Date Due  . Hepatitis C Screening  Never done  . COLONOSCOPY (Pts 45-54yrs Insurance coverage will need  to be confirmed)  Never done  . MAMMOGRAM  Never done  . INFLUENZA VACCINE  04/10/2021  . PAP SMEAR-Modifier  07/31/2021  . TETANUS/TDAP  07/31/2027  . COVID-19 Vaccine  Completed  . HIV Screening  Completed  . HPV VACCINES  Aged Out    Discussed health benefits of physical activity, and encouraged her to engage in regular exercise appropriate for her age and condition.  ***  No follow-ups on file.     {provider attestation***:1}   Lelon Huh, MD  [facility] [phone] (phone) [phone] (fax)  [place]

## 2021-01-30 LAB — CYTOLOGY - PAP
Comment: NEGATIVE
High risk HPV: NEGATIVE

## 2021-01-31 ENCOUNTER — Ambulatory Visit: Payer: Managed Care, Other (non HMO)

## 2021-02-17 ENCOUNTER — Encounter: Payer: Self-pay | Admitting: Obstetrics and Gynecology

## 2021-02-17 LAB — HM MAMMOGRAPHY

## 2021-04-21 ENCOUNTER — Other Ambulatory Visit: Payer: Self-pay

## 2021-04-21 ENCOUNTER — Encounter: Payer: Self-pay | Admitting: Family Medicine

## 2021-04-21 ENCOUNTER — Ambulatory Visit: Payer: BC Managed Care – PPO | Admitting: Family Medicine

## 2021-04-21 VITALS — BP 114/64 | HR 69 | Temp 98.0°F | Ht 63.0 in | Wt 156.2 lb

## 2021-04-21 DIAGNOSIS — Z13228 Encounter for screening for other metabolic disorders: Secondary | ICD-10-CM

## 2021-04-21 DIAGNOSIS — E7800 Pure hypercholesterolemia, unspecified: Secondary | ICD-10-CM | POA: Diagnosis not present

## 2021-04-21 DIAGNOSIS — E559 Vitamin D deficiency, unspecified: Secondary | ICD-10-CM | POA: Diagnosis not present

## 2021-04-21 DIAGNOSIS — Z1211 Encounter for screening for malignant neoplasm of colon: Secondary | ICD-10-CM

## 2021-04-21 DIAGNOSIS — Z130 Encounter for screening for diseases of the blood and blood-forming organs and certain disorders involving the immune mechanism: Secondary | ICD-10-CM

## 2021-04-21 DIAGNOSIS — S29012A Strain of muscle and tendon of back wall of thorax, initial encounter: Secondary | ICD-10-CM

## 2021-04-21 MED ORDER — PEG 3350-KCL-NA BICARB-NACL 420 G PO SOLR: 4000.0000 mL | Freq: Once | ORAL | 0 refills | Status: AC

## 2021-04-21 NOTE — Progress Notes (Signed)
      Established patient visit   Patient: Suzanne Sanchez   DOB: 11-11-1970   50 y.o. Female  MRN: [id number] Visit Date: 04/21/2021  Today's healthcare provider: Lelon Huh, MD   No chief complaint on file.  Subjective    HPI  Patient provided urine specimen upon coming to appt. Would like to receive labs.   Left scapula having burning sensation for about a week and half. Not sure if she had did any extraneous movement that could have caused pain. Poor circulation in finger tips around the same time, would press down on fingers and it would take a while to fingers "look back normal". Reports to be feeling well. Sleeping okay. Eating well and consuming a well balanced diet. Not exercising at the moment.    Medications: No outpatient medications prior to visit.   No facility-administered medications prior to visit.    Review of Systems  Musculoskeletal:  Positive for back pain.      Objective    BP 114/64 (BP Location: Left Arm, Patient Position: Sitting, Cuff Size: Normal)   Pulse 69   Temp 98 F (36.7 C) (Oral)   Ht '5\' 3"'$  (1.6 m)   Wt 156 lb 3.2 oz (70.9 kg)   SpO2 100%   BMI 27.67 kg/m   Physical Exam  General appearance:  Well developed, well nourished female, cooperative and in no acute distress Head: Normocephalic, without obvious abnormality, atraumatic Respiratory: Respirations even and unlabored, normal respiratory rate MS: Tender across mid scapular muscles. FROM of neck and shoulders.  Neurologic: Mental status: Alert, oriented to person, place, and time, thought content appropriate.    Assessment & Plan    1. Vitamin D deficiency  - VITAMIN D 25 Hydroxy (Vit-D Deficiency, Fractures)  2. Pure hypercholesterolemia  - Lipid panel  3. Encounter for screening for hematologic disorder  - CBC  4. Screening for metabolic disorder  - Comprehensive metabolic panel  5. Colon cancer screening  - Ambulatory referral to gastroenterology for  colonoscopy  6. Strain of rhomboid muscle, initial encounter Recommend frequent application of ice packs. OTC NSAIDs prn. Call if not much better in another 3-4 weeks.       The entirety of the information documented in the History of Present Illness, Review of Systems and Physical Exam were personally obtained by me. Portions of this information were initially documented by the CMA and reviewed by me for thoroughness and accuracy.     Lelon Huh, MD  [facility] [phone] (phone) [phone] (fax)  [place]

## 2021-04-21 NOTE — Progress Notes (Signed)
Gastroenterology Pre-Procedure Review  Request Date: 06/02/2021 Requesting Physician: Dr. Allen Norris  PATIENT REVIEW QUESTIONS: The patient responded to the following health history questions as indicated:    1. Are you having any GI issues? no 2. Do you have a personal history of Polyps? no 3. Do you have a family history of Colon Cancer or Polyps?  Adopted so not sure 4. Diabetes Mellitus? no 5. Joint replacements in the past 12 months?no 6. Major health problems in the past 3 months?no 7. Any artificial heart valves, MVP, or defibrillator?no    MEDICATIONS & ALLERGIES:    Patient reports the following regarding taking any anticoagulation/antiplatelet therapy:   Plavix, Coumadin, Eliquis, Xarelto, Lovenox, Pradaxa, Brilinta, or Effient? no Aspirin? no  Patient confirms/reports the following medications:  No current outpatient medications on file.   No current facility-administered medications for this visit.    Patient confirms/reports the following allergies:  No Known Allergies  No orders of the defined types were placed in this encounter.   AUTHORIZATION INFORMATION Primary Insurance: 1D#: Group #:  Secondary Insurance: 1D#: Group #:  SCHEDULE INFORMATION: Date: 06/02/2021 Time: Location: [facility]

## 2021-04-21 NOTE — Patient Instructions (Signed)
.   Please review the attached list of medications and notify my office if there are any errors.   . Please bring all of your medications to every appointment so we can make sure that our medication list is the same as yours.   

## 2021-04-22 LAB — LIPID PANEL
Chol/HDL Ratio: 4.9 ratio — ABNORMAL HIGH (ref 0.0–4.4)
Cholesterol, Total: 266 mg/dL — ABNORMAL HIGH (ref 100–199)
HDL: 54 mg/dL (ref >39)
LDL Chol Calc (NIH): 190 mg/dL — ABNORMAL HIGH (ref 0–99)
Triglycerides: 124 mg/dL (ref 0–149)
VLDL Cholesterol Cal: 22 mg/dL (ref 5–40)

## 2021-04-22 LAB — CBC
Hematocrit: 41.8 % (ref 34.0–46.6)
Hemoglobin: 13.8 g/dL (ref 11.1–15.9)
MCH: 28.9 pg (ref 26.6–33.0)
MCHC: 33 g/dL (ref 31.5–35.7)
MCV: 87 fL (ref 79–97)
Platelets: 358 10*3/uL (ref 150–450)
RBC: 4.78 x10E6/uL (ref 3.77–5.28)
RDW: 13.1 % (ref 11.7–15.4)
WBC: 6.1 10*3/uL (ref 3.4–10.8)

## 2021-04-22 LAB — VITAMIN D 25 HYDROXY (VIT D DEFICIENCY, FRACTURES): Vit D, 25-Hydroxy: 27.4 ng/mL — ABNORMAL LOW (ref 30.0–100.0)

## 2021-04-22 LAB — COMPREHENSIVE METABOLIC PANEL
ALT: 18 IU/L (ref 0–32)
AST: 19 IU/L (ref 0–40)
Albumin/Globulin Ratio: 1.9 (ref 1.2–2.2)
Albumin: 4.5 g/dL (ref 3.8–4.8)
Alkaline Phosphatase: 57 IU/L (ref 44–121)
BUN/Creatinine Ratio: 7 — ABNORMAL LOW (ref 9–23)
BUN: 6 mg/dL (ref 6–24)
Bilirubin Total: 0.5 mg/dL (ref 0.0–1.2)
CO2: 25 mmol/L (ref 20–29)
Calcium: 9.3 mg/dL (ref 8.7–10.2)
Chloride: 103 mmol/L (ref 96–106)
Creatinine, Ser: 0.83 mg/dL (ref 0.57–1.00)
Globulin, Total: 2.4 g/dL (ref 1.5–4.5)
Glucose: 94 mg/dL (ref 65–99)
Potassium: 4.3 mmol/L (ref 3.5–5.2)
Sodium: 139 mmol/L (ref 134–144)
Total Protein: 6.9 g/dL (ref 6.0–8.5)
eGFR: 86 mL/min/{1.73_m2} (ref >59)

## 2021-05-24 ENCOUNTER — Encounter: Payer: Self-pay | Admitting: Anesthesiology

## 2021-05-25 ENCOUNTER — Encounter: Payer: Self-pay | Admitting: Gastroenterology

## 2021-05-30 NOTE — Anesthesia Preprocedure Evaluation (Deleted)
Anesthesia Evaluation    Airway        Dental   Pulmonary           Cardiovascular      Neuro/Psych PSYCHIATRIC DISORDERS Anxiety    GI/Hepatic GERD  ,  Endo/Other    Renal/GU      Musculoskeletal  (+) Arthritis ,   Abdominal   Peds  Hematology   Anesthesia Other Findings   Reproductive/Obstetrics                             Anesthesia Physical Anesthesia Plan  ASA: 2  Anesthesia Plan: General   Post-op Pain Management:    Induction: Intravenous  PONV Risk Score and Plan: 3 and Propofol infusion, TIVA and Treatment may vary due to age or medical condition  Airway Management Planned: Natural Airway  Additional Equipment:   Intra-op Plan:   Post-operative Plan:   Informed Consent:   Plan Discussed with:   Anesthesia Plan Comments:         Anesthesia Quick Evaluation

## 2021-06-29 ENCOUNTER — Encounter: Payer: Self-pay | Admitting: Family Medicine

## 2021-06-29 DIAGNOSIS — E7800 Pure hypercholesterolemia, unspecified: Secondary | ICD-10-CM

## 2021-07-18 LAB — COMPREHENSIVE METABOLIC PANEL
ALT: 16 IU/L (ref 0–32)
AST: 16 IU/L (ref 0–40)
Albumin/Globulin Ratio: 2.7 — ABNORMAL HIGH (ref 1.2–2.2)
Albumin: 4.8 g/dL (ref 3.8–4.8)
Alkaline Phosphatase: 58 IU/L (ref 44–121)
BUN/Creatinine Ratio: 9 (ref 9–23)
BUN: 9 mg/dL (ref 6–24)
Bilirubin Total: 0.4 mg/dL (ref 0.0–1.2)
CO2: 25 mmol/L (ref 20–29)
Calcium: 9.9 mg/dL (ref 8.7–10.2)
Chloride: 102 mmol/L (ref 96–106)
Creatinine, Ser: 0.96 mg/dL (ref 0.57–1.00)
Globulin, Total: 1.8 g/dL (ref 1.5–4.5)
Glucose: 97 mg/dL (ref 70–99)
Potassium: 4.3 mmol/L (ref 3.5–5.2)
Sodium: 141 mmol/L (ref 134–144)
Total Protein: 6.6 g/dL (ref 6.0–8.5)
eGFR: 72 mL/min/{1.73_m2} (ref >59)

## 2021-07-18 LAB — LIPID PANEL
Chol/HDL Ratio: 2.5 ratio (ref 0.0–4.4)
Cholesterol, Total: 132 mg/dL (ref 100–199)
HDL: 53 mg/dL (ref >39)
LDL Chol Calc (NIH): 62 mg/dL (ref 0–99)
Triglycerides: 87 mg/dL (ref 0–149)
VLDL Cholesterol Cal: 17 mg/dL (ref 5–40)

## 2021-07-20 MED ORDER — ROSUVASTATIN CALCIUM 20 MG PO TABS: 20.0000 mg | ORAL_TABLET | Freq: Every day | ORAL | 1 refills | Status: DC

## 2021-07-20 NOTE — Addendum Note (Signed)
Addended by: Birdie Sons on: 07/20/2021 08:15 AM   Modules accepted: Orders

## 2021-08-01 ENCOUNTER — Encounter: Payer: Self-pay | Admitting: Gastroenterology

## 2021-09-13 ENCOUNTER — Other Ambulatory Visit: Payer: Self-pay

## 2021-12-28 ENCOUNTER — Encounter: Payer: Self-pay | Admitting: Gastroenterology

## 2022-01-05 ENCOUNTER — Ambulatory Visit
Admission: RE | Admit: 2022-01-05 | Payer: BC Managed Care – PPO | Source: Home / Self Care | Admitting: Gastroenterology

## 2022-01-05 HISTORY — DX: Gastro-esophageal reflux disease without esophagitis: K21.9

## 2022-01-05 HISTORY — DX: Unspecified osteoarthritis, unspecified site: M19.90

## 2022-01-05 HISTORY — DX: Motion sickness, initial encounter: T75.3XXA

## 2022-01-05 SURGERY — COLONOSCOPY WITH PROPOFOL
Anesthesia: Choice

## 2022-02-20 ENCOUNTER — Encounter: Payer: Self-pay | Admitting: Family Medicine

## 2022-03-07 ENCOUNTER — Other Ambulatory Visit: Payer: Self-pay | Admitting: Family Medicine

## 2022-03-07 DIAGNOSIS — E7800 Pure hypercholesterolemia, unspecified: Secondary | ICD-10-CM

## 2022-06-20 ENCOUNTER — Ambulatory Visit (INDEPENDENT_AMBULATORY_CARE_PROVIDER_SITE_OTHER): Payer: BC Managed Care – PPO | Admitting: Family Medicine

## 2022-06-20 ENCOUNTER — Encounter: Payer: Self-pay | Admitting: Family Medicine

## 2022-06-20 VITALS — BP 109/70 | HR 86 | Temp 98.4°F | Resp 16 | Ht 63.0 in | Wt 158.6 lb

## 2022-06-20 DIAGNOSIS — Z0000 Encounter for general adult medical examination without abnormal findings: Secondary | ICD-10-CM | POA: Diagnosis not present

## 2022-06-20 DIAGNOSIS — E559 Vitamin D deficiency, unspecified: Secondary | ICD-10-CM

## 2022-06-20 DIAGNOSIS — Z1211 Encounter for screening for malignant neoplasm of colon: Secondary | ICD-10-CM | POA: Diagnosis not present

## 2022-06-20 DIAGNOSIS — Z23 Encounter for immunization: Secondary | ICD-10-CM

## 2022-06-20 DIAGNOSIS — E7800 Pure hypercholesterolemia, unspecified: Secondary | ICD-10-CM

## 2022-06-20 DIAGNOSIS — R928 Other abnormal and inconclusive findings on diagnostic imaging of breast: Secondary | ICD-10-CM

## 2022-06-20 NOTE — Patient Instructions (Addendum)
Please review the attached list of medications and notify my office if there are any errors.  Referral for mammogram sent to [facility] (Imaging and Radiology) Tel: [phone]

## 2022-06-20 NOTE — Progress Notes (Signed)
I,Suzanne Sanchez,acting as a Education administrator for Suzanne Huh, MD.,have documented all relevant documentation on the behalf of Suzanne Huh, MD,as directed by  Suzanne Huh, MD while in the presence of Suzanne Huh, MD.    Complete physical exam   Patient: Suzanne Sanchez   DOB: 1971-03-05   51 y.o. Female  MRN: [id number] Visit Date: 06/20/2022  Today's healthcare provider: Lelon Huh, MD   Chief Complaint  Patient presents with   Annual Exam   Subjective    Suzanne Sanchez is a 51 y.o. female who presents today for a complete physical exam.  She reports consuming a general diet. The patient does not participate in regular exercise at present. She generally feels fairly well. She reports sleeping fairly well. She does not have additional problems to discuss today.     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   Family History  Adopted: Yes   No Known Allergies  Patient Care Team: Suzanne Sons, MD as PCP - General (Family Medicine) Suzanne Sanchez, Suzanne Gleason, MD (General Surgery)   Medications: Outpatient Medications Prior to Visit  Medication Sig   ASPIRIN 81 PO Take by mouth.   Multiple Vitamins-Minerals (HAIR SKIN AND NAILS FORMULA PO) Take by mouth daily.   rosuvastatin (CRESTOR) 20 MG tablet TAKE ONE TABLET BY MOUTH DAILY   cholecalciferol (VITAMIN D3) 25 MCG (1000 UNIT) tablet Take 1,000 Units by mouth daily. (Patient not taking: Reported on 06/20/2022)   No facility-administered medications prior to visit.    Review of Systems  Eyes:  Positive for itching.  Neurological:  Positive for dizziness and headaches.  All other systems reviewed and are negative.   Last lipids Lab Results  Component Value Date   CHOL 132 07/17/2021   HDL 53 07/17/2021   LDLCALC 62 07/17/2021   TRIG 87 07/17/2021   CHOLHDL 2.5 07/17/2021    Last vitamin D Lab Results  Component Value Date   VD25OH 27.4 (L) 04/21/2021       Objective    BP 109/70 (BP Location: Left Arm, Patient Position: Sitting, Cuff Size: Large)   Pulse 86  Temp 98.4 F (36.9 C) (Oral)   Resp 16   Ht '5\' 3"'$  (1.6 m)   Wt 158 lb 9.6 oz (71.9 kg)   LMP 11/24/2021 (Approximate)   SpO2 100%   BMI 28.09 kg/m  BP Readings from Last 3 Encounters:  06/20/22 109/70  04/21/21 114/64  01/24/21 120/80   Wt Readings from Last 3 Encounters:  06/20/22 158 lb 9.6 oz (71.9 kg)  04/21/21 156 lb 3.2 oz (70.9 kg)  01/24/21 153 lb (69.4 kg)    Physical Exam   General Appearance:    Well developed, well nourished female. Alert, cooperative, in no acute distress, appears stated age   Head:    Normocephalic, without obvious abnormality, atraumatic  Eyes:    PERRL, conjunctiva/corneas clear, EOM's intact, fundi    benign, both eyes  Ears:    Normal  TM's and external ear canals, both ears  Nose:   Nares normal, septum midline, mucosa normal, no drainage    or sinus tenderness  Throat:   Lips, mucosa, and tongue normal; teeth and gums normal  Neck:   Supple, symmetrical, trachea midline, no adenopathy;    thyroid:  no enlargement/tenderness/nodules; no carotid   bruit or JVD  Back:     Symmetric, no curvature, ROM normal, no CVA tenderness  Lungs:     Clear to auscultation bilaterally, respirations unlabored  Chest Wall:    No tenderness or deformity   Heart:    Normal heart rate. Normal rhythm. No murmurs, rubs, or gallops.   Breast Exam:    normal appearance, no masses or tenderness, No nipple retraction or dimpling, Normal to palpation without dominant masses  Abdomen:     Soft, non-tender, bowel sounds active all four quadrants,    no masses, no organomegaly  Pelvic:    deferred  Extremities:   All extremities are intact. No cyanosis or edema  Pulses:   2+ and symmetric all extremities  Skin:   Skin color, texture, turgor normal. A few very small brown lentigo on back. One non-raised brown macular lesion just left of midline a bit larger than other at about 5x6 mm.   Lymph nodes:   Cervical, supraclavicular, and axillary nodes normal  Neurologic:   CNII-XII intact, normal strength, sensation and reflexes    throughout     Last depression screening scores    06/20/2022    9:05 AM 04/21/2021   10:14 AM 12/20/2020    2:43 PM  PHQ 2/9 Scores  PHQ - 2 Score 0 0 0  PHQ- 9 Score 1 0 1   Last fall risk screening    06/20/2022    9:05 AM  [place] in the past year? 0  Number falls in past yr: 0  Injury with Fall? 0  Risk for fall due to : No Fall Risks  Follow up Falls evaluation completed   Last Audit-C alcohol use screening    06/20/2022    9:06 AM  Alcohol Use Disorder Test (AUDIT)  1. How often do you have a drink containing alcohol? 1  2. How many drinks containing alcohol do you have on a typical day when  you are drinking? 0  3. How often do you have six or more drinks on one occasion? 0  AUDIT-C Score 1   A score of 3 or more in women, and 4 or more in men indicates increased risk for alcohol abuse, EXCEPT if all of the points are from question  1   No results found for any visits on 06/20/22.  Assessment & Plan    Routine Health Maintenance and Physical Exam  Exercise Activities and Dietary recommendations  Goals   None     Immunization History  Administered Date(s) Administered   Influenza, Seasonal, Injecte, Preservative Fre 07/23/2013   Influenza,inj,Quad PF,6+ Mos 10/02/2018   Moderna Sars-Covid-2 Vaccination 09/23/2019, 10/21/2019, 07/06/2020   Td 07/30/2017   Tdap 09/26/2006, 07/23/2013    Health Maintenance  Topic Date Due   Hepatitis C Screening  Never done   COLONOSCOPY (Pts 45-7yr Insurance coverage will need to be confirmed)  Never done   Zoster Vaccines- Shingrix (1 of 2) Never done   COVID-19 Vaccine (4 - Moderna series) 08/31/2020   INFLUENZA VACCINE  04/10/2022   MAMMOGRAM  02/18/2023   PAP SMEAR-Modifier  01/24/2026   TETANUS/TDAP  07/31/2027   HIV Screening  Completed   HPV VACCINES  Aged Out    Discussed health benefits of physical activity, and encouraged her to engage in regular exercise appropriate for her age and condition.   2. Need for influenza vaccination  - Flu Vaccine QUAD 671moM (Fluarix, Fluzone & Alfiuria Quad PF)  3. Need for shingles vaccine  - Varicella-zoster vaccine IM  4. Colon cancer screening  - Cologuard  5. Vitamin D deficiency She stopped vitamin D supplement about 6 months ago since it was upsetting her stomach.  - VITAMIN D 25 Hydroxy (Vit-D Deficiency, Fractures)  6. Pure hypercholesterolemia She is tolerating rosuvastatin well with no adverse effects.  She is working on eating healthy diet and would like to cut back on rosuvastatin if possible.  - CBC - Comprehensive metabolic panel - Lipid panel     The  entirety of the information documented in the History of Present Illness, Review of Systems and Physical Exam were personally obtained by me. Portions of this information were initially documented by the CMA and reviewed by me for thoroughness and accuracy.     DoLelon HuhMD  [phone]) [phone])  [employer]

## 2022-06-21 LAB — COMPREHENSIVE METABOLIC PANEL
ALT: 32 IU/L (ref 0–32)
AST: 23 IU/L (ref 0–40)
Albumin/Globulin Ratio: 1.9 (ref 1.2–2.2)
Albumin: 4.5 g/dL (ref 3.8–4.9)
Alkaline Phosphatase: 62 IU/L (ref 44–121)
BUN/Creatinine Ratio: 10 (ref 9–23)
BUN: 8 mg/dL (ref 6–24)
Bilirubin Total: 0.5 mg/dL (ref 0.0–1.2)
CO2: 25 mmol/L (ref 20–29)
Calcium: 9.7 mg/dL (ref 8.7–10.2)
Chloride: 105 mmol/L (ref 96–106)
Creatinine, Ser: 0.82 mg/dL (ref 0.57–1.00)
Globulin, Total: 2.4 g/dL (ref 1.5–4.5)
Glucose: 99 mg/dL (ref 70–99)
Potassium: 4.3 mmol/L (ref 3.5–5.2)
Sodium: 142 mmol/L (ref 134–144)
Total Protein: 6.9 g/dL (ref 6.0–8.5)
eGFR: 87 mL/min/{1.73_m2} (ref >59)

## 2022-06-21 LAB — CBC
Hematocrit: 43.6 % (ref 34.0–46.6)
Hemoglobin: 14.4 g/dL (ref 11.1–15.9)
MCH: 28.8 pg (ref 26.6–33.0)
MCHC: 33 g/dL (ref 31.5–35.7)
MCV: 87 fL (ref 79–97)
Platelets: 350 10*3/uL (ref 150–450)
RBC: 5 x10E6/uL (ref 3.77–5.28)
RDW: 12.9 % (ref 11.7–15.4)
WBC: 4.9 10*3/uL (ref 3.4–10.8)

## 2022-06-21 LAB — LIPID PANEL
Chol/HDL Ratio: 2.5 ratio (ref 0.0–4.4)
Cholesterol, Total: 140 mg/dL (ref 100–199)
HDL: 55 mg/dL (ref >39)
LDL Chol Calc (NIH): 74 mg/dL (ref 0–99)
Triglycerides: 49 mg/dL (ref 0–149)
VLDL Cholesterol Cal: 11 mg/dL (ref 5–40)

## 2022-06-21 LAB — VITAMIN D 25 HYDROXY (VIT D DEFICIENCY, FRACTURES): Vit D, 25-Hydroxy: 25.9 ng/mL — ABNORMAL LOW (ref 30.0–100.0)

## 2022-07-28 ENCOUNTER — Encounter: Payer: Self-pay | Admitting: Family Medicine

## 2022-07-28 DIAGNOSIS — E7800 Pure hypercholesterolemia, unspecified: Secondary | ICD-10-CM

## 2022-07-28 LAB — COLOGUARD: COLOGUARD: NEGATIVE

## 2022-07-30 MED ORDER — ROSUVASTATIN CALCIUM 20 MG PO TABS: 20.0000 mg | ORAL_TABLET | Freq: Every day | ORAL | 3 refills | Status: DC

## 2022-10-23 LAB — HM MAMMOGRAPHY

## 2022-10-31 ENCOUNTER — Encounter: Payer: Self-pay | Admitting: Family Medicine

## 2022-10-31 ENCOUNTER — Telehealth: Payer: Self-pay

## 2022-10-31 DIAGNOSIS — E7800 Pure hypercholesterolemia, unspecified: Secondary | ICD-10-CM

## 2022-10-31 MED ORDER — ROSUVASTATIN CALCIUM 10 MG PO TABS: 20.0000 mg | ORAL_TABLET | Freq: Every day | ORAL | 1 refills | Status: DC

## 2022-10-31 NOTE — Telephone Encounter (Signed)
Called patient to advise of normal mammogram report. She did ask about changing her rosuvastatin to 10 mg. Per Dr. Caryn Section lab reports 06/20/22. If you would like to try a lower dose we could change the rosuvastatin to 44m and recheck in 3 months.

## 2023-01-21 IMAGING — US US PELVIS COMPLETE
1 series · 14 of 25 positions shown · non-contrast
Comparison: None.

CLINICAL DATA: Intermittent pelvic pain.

EXAM:
TRANSABDOMINAL ULTRASOUND OF PELVIS
TECHNIQUE: Transabdominal ultrasound examination of the pelvis was performed
including evaluation of the uterus, ovaries, adnexal regions, and
pelvic cul-de-sac.

[Series 1: us pelvis complete · 0.19mm/px · 14 of 38 slices shown]
[im 1/38]
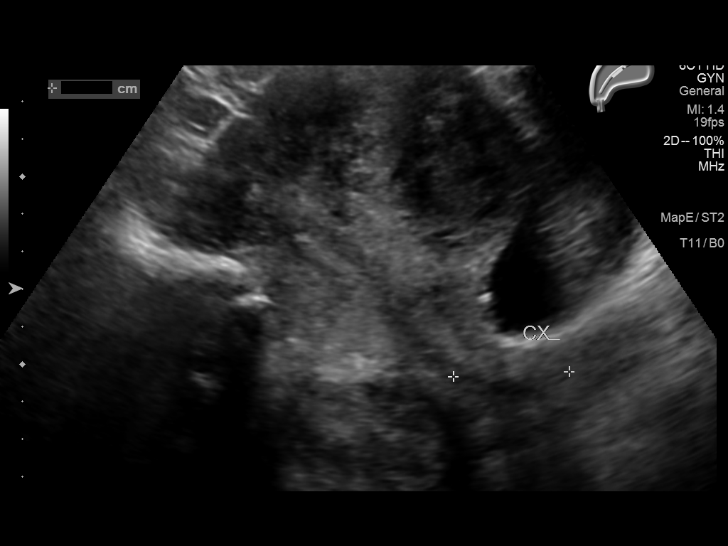
[im 4/38]
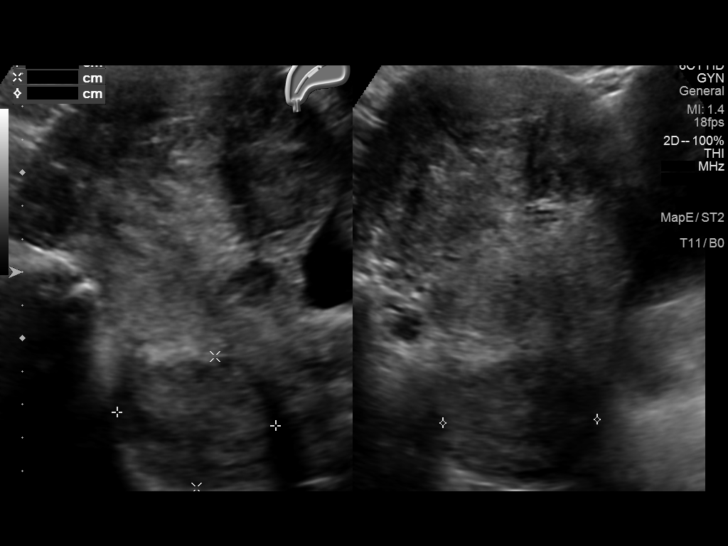
[im 7/38]
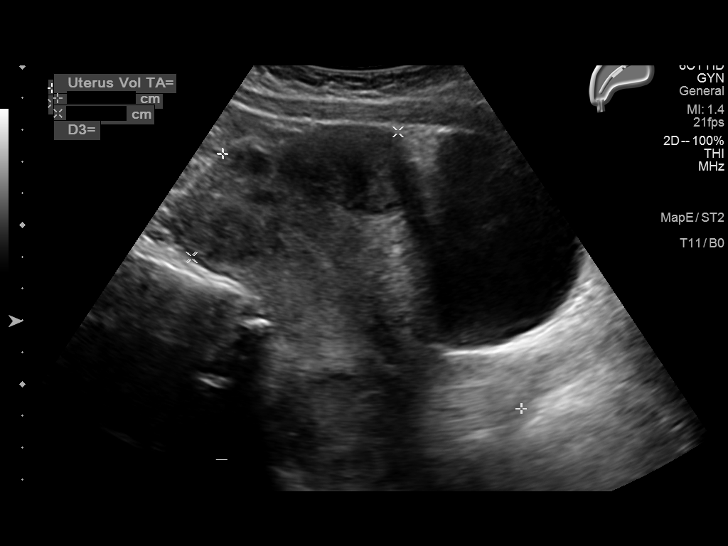
[im 10/38]
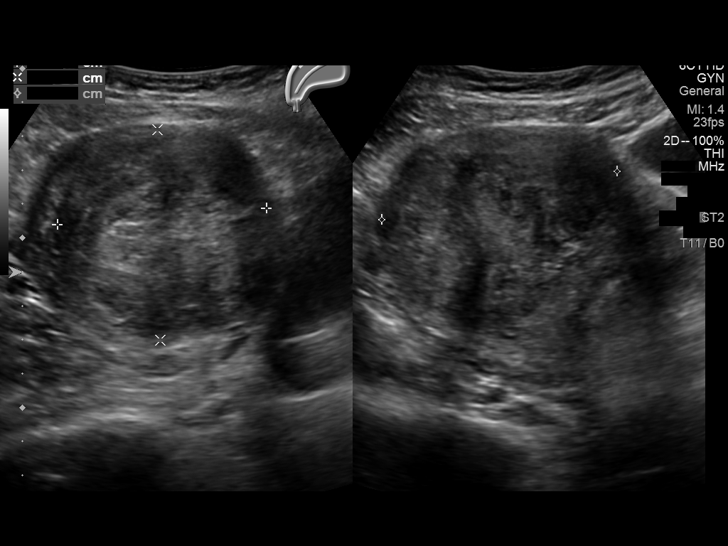
[im 13/38]
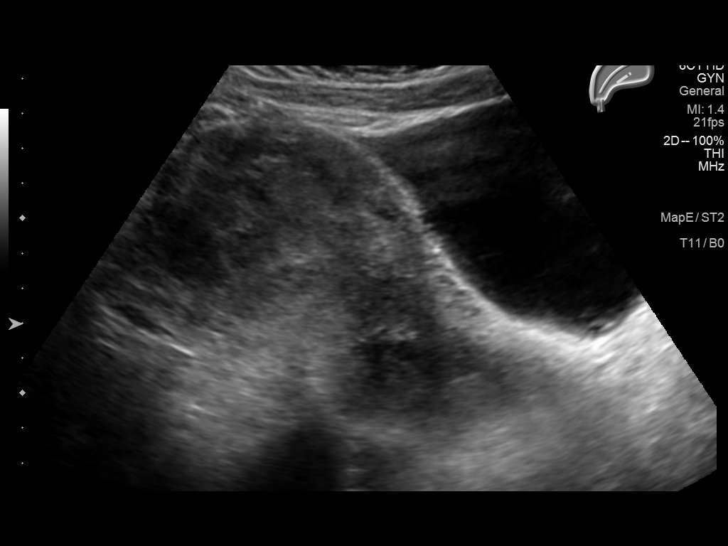
[im 14/38]
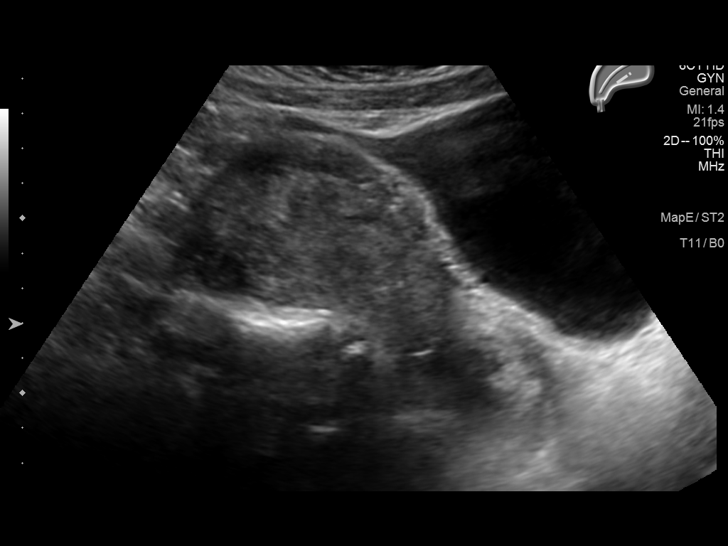
[im 17/38]
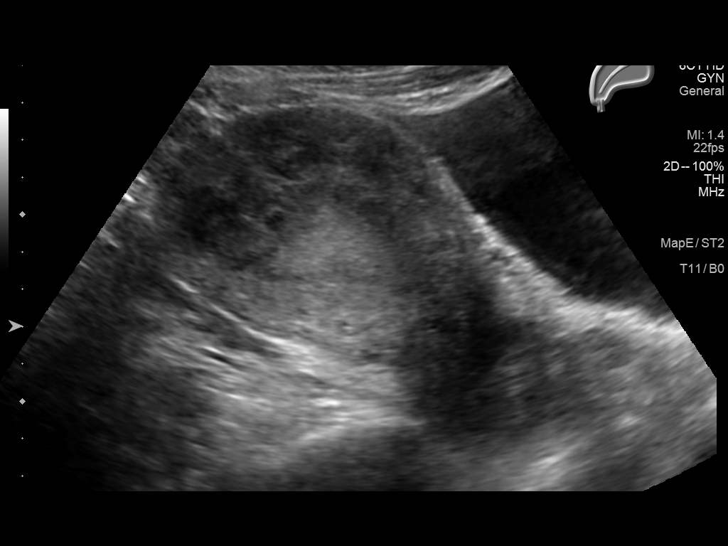
[im 21/38]
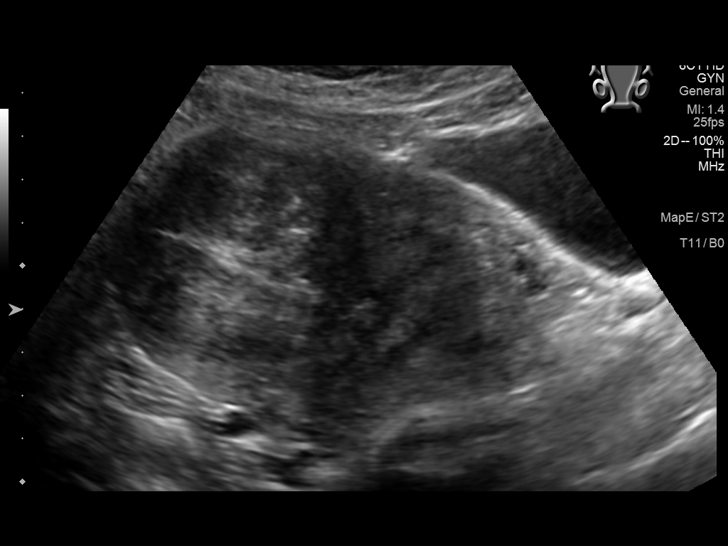
[im 24/38]
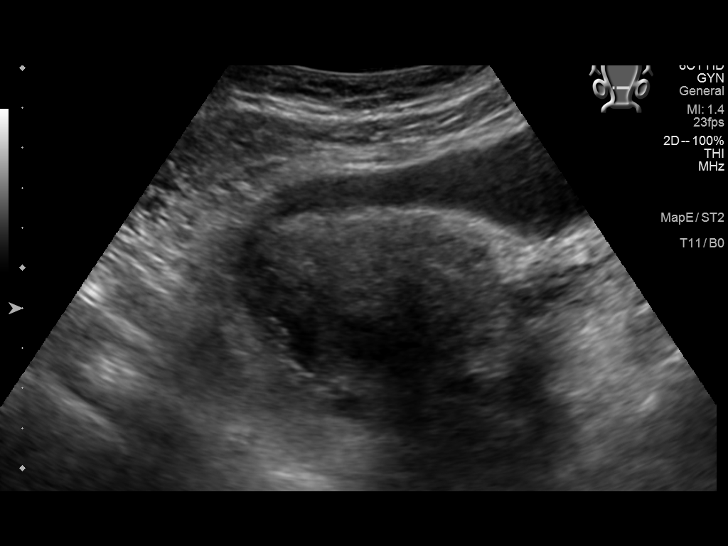
[im 25/38]
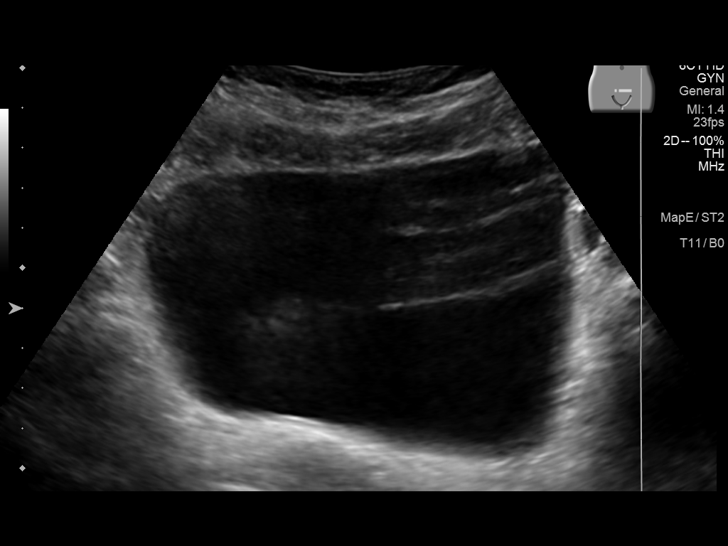
[im 28/38]
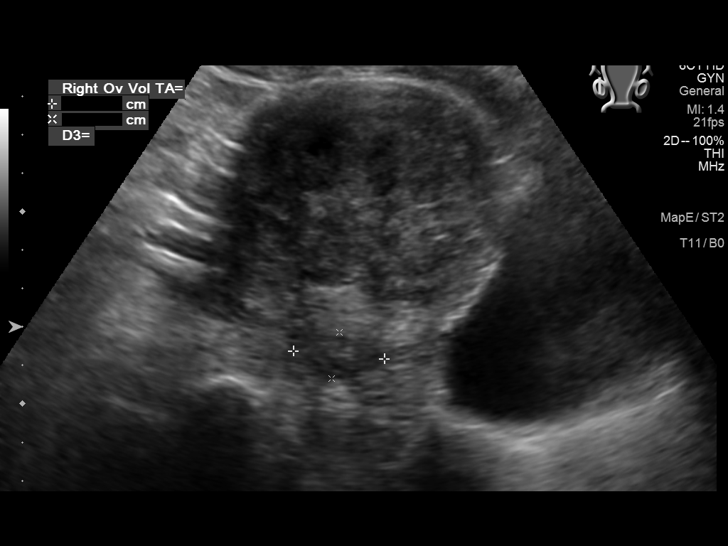
[im 31/38]
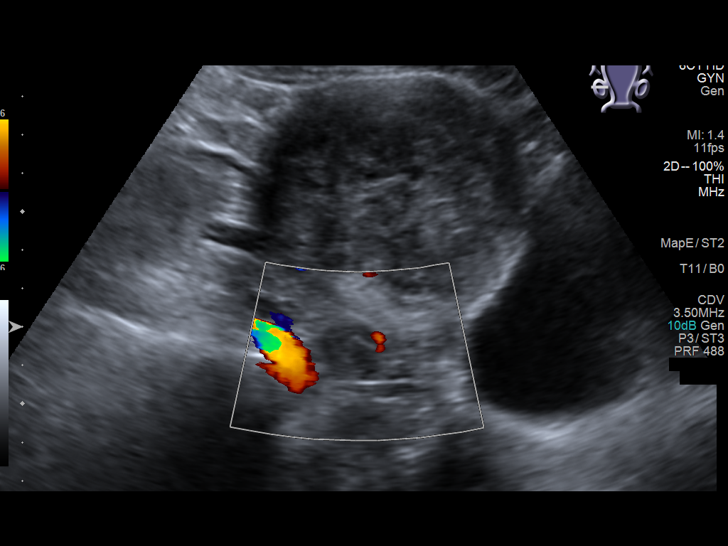
[im 34/38]
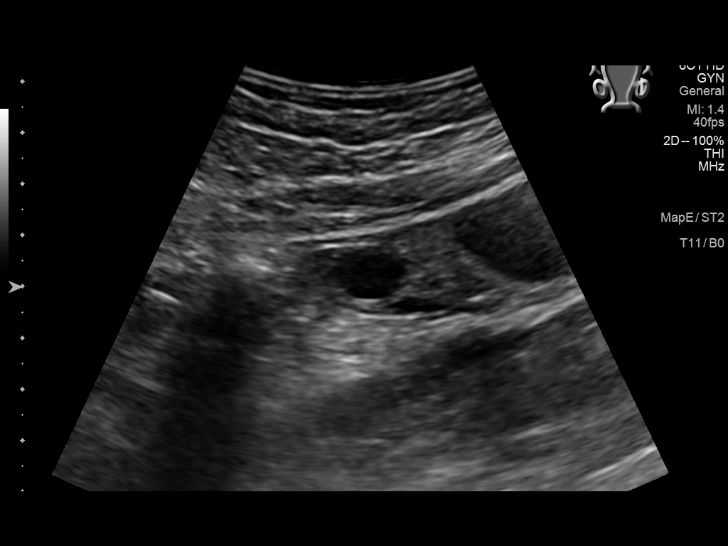
[im 38/38]
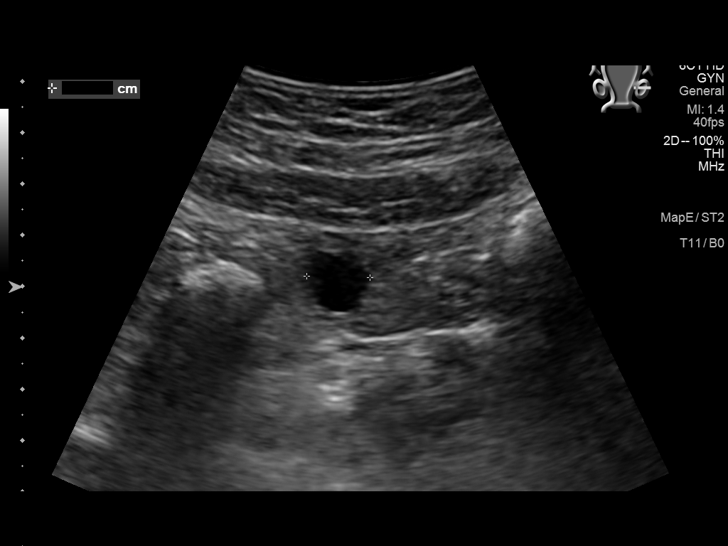

[14 of 25 positions shown; findings below may reference images not displayed]

FINDINGS: Uterus

Measurements: 12.4 cm x 7.6 cm x 6.8 cm = volume: 333 mL. 4.6 cm x
3.8 cm x 4.7 cm and 6.2 cm x 6.2 cm and 7.1 cm heterogeneous uterine
fibroids are seen. An additional 2.3 cm x 1.8 cm x 2.1 cm uterine
fibroid is noted.

Endometrium

Thickness: 10.0 mm.  No focal abnormality visualized.

Right ovary

The right ovary is not clearly identified.

Left ovary

Measurements: 3.1 cm x 1.2 cm x 2.0 cm = volume: 3.6 mL. A 1.2 cm
diameter anechoic structure is seen within the left ovary. No
abnormal flow is noted within this region on color Doppler
evaluation.

Other findings:  No abnormal free fluid.
IMPRESSION: 1. Multiple heterogeneous uterine fibroids.
2. Small left ovarian follicle/cyst.
3. Nonvisualization of the right ovary.

## 2023-04-12 ENCOUNTER — Ambulatory Visit: Payer: BC Managed Care – PPO | Attending: Family Medicine

## 2023-04-12 ENCOUNTER — Encounter: Payer: Self-pay | Admitting: Family Medicine

## 2023-04-12 ENCOUNTER — Ambulatory Visit: Payer: BC Managed Care – PPO | Admitting: Family Medicine

## 2023-04-12 VITALS — BP 111/64 | HR 71 | Temp 98.2°F | Resp 12 | Ht 63.0 in | Wt 154.0 lb

## 2023-04-12 DIAGNOSIS — G4700 Insomnia, unspecified: Secondary | ICD-10-CM

## 2023-04-12 DIAGNOSIS — R42 Dizziness and giddiness: Secondary | ICD-10-CM

## 2023-04-12 DIAGNOSIS — E7800 Pure hypercholesterolemia, unspecified: Secondary | ICD-10-CM | POA: Diagnosis not present

## 2023-04-12 MED ORDER — ROSUVASTATIN CALCIUM 10 MG PO TABS: 10.0000 mg | ORAL_TABLET | Freq: Every day | ORAL | 1 refills | Status: DC

## 2023-04-12 MED ORDER — ZOLPIDEM TARTRATE 5 MG PO TABS
5.0000 mg | ORAL_TABLET | Freq: Every evening | ORAL | 1 refills | Status: DC | PRN
Start: 2023-04-12 — End: 2024-02-22

## 2023-04-12 NOTE — Patient Instructions (Signed)
Please review the attached list of medications and notify my office if there are any errors.   Please call and let me know if you do not receive the Zio Heart Monitor at your home by next Thursday.

## 2023-04-12 NOTE — Progress Notes (Signed)
      Established patient visit   Patient: Suzanne Sanchez   DOB: 21-Jun-1971   52 y.o. Female  MRN: [id number] Visit Date: 04/12/2023  Today's healthcare provider: Mila Merry, MD   Chief Complaint  Patient presents with   Fatigue   Subjective    Discussed the use of AI scribe software for clinical note transcription with the patient, who gave verbal consent to proceed.  History of Present Illness   The patient, with a history of hypertension and hyperlipidemia, presents with intermittent episodes of dizziness and blurred vision that started approximately 4-6 weeks ago. These episodes, characterized by lightheadedness and lasting for about 10-15 seconds, occur irrespective of the patient's position and are not associated with any specific activity. The patient denies any associated chest pain, palpitations, or dyspnea.  The patient also reports difficulty sleeping, attributing it to recent work-related stress. She expresses a desire for a sleep aid, having previously used one but found it too strong.  The patient notes a recent increase in red meat consumption, which she is now trying to reduce. She also reports discontinuing her cholesterol medication due to receiving a higher dose (20mg ) than prescribed (10mg ) from her pharmacy.       Medications: Outpatient Medications Prior to Visit  Medication Sig   ASPIRIN 81 PO Take by mouth.   [DISCONTINUED] rosuvastatin (CRESTOR) 10 MG tablet Take 2 tablets (20 mg total) by mouth daily.   [DISCONTINUED] Multiple Vitamins-Minerals (HAIR SKIN AND NAILS FORMULA PO) Take by mouth daily.   No facility-administered medications prior to visit.   Review of Systems       Objective    BP 111/64 (BP Location: Left Arm, Patient Position: Sitting, Cuff Size: Normal)   Pulse 71   Temp 98.2 F (36.8 C) (Temporal)   Resp 12   Ht 5\' 3"  (1.6 m)   Wt 154 lb (69.9 kg)   LMP 03/10/2023 (Exact Date)   SpO2 99%   BMI 27.28 kg/m  Physical  Exam   CHEST: Clear to auscultation. CARDIOVASCULAR: Normal heart sounds, no murmurs, rubs, or gallops.   Assessment & Plan     Assessment and Plan    Episodic Dizziness New onset of episodic dizziness and blurred vision over the past 4-6 weeks. Episodes last 10-15 seconds and occur in various positions. No associated chest pain, palpitations, or dyspnea. Normal EKG in office. -Order home heart monitor to rule out arrhythmia. -Order blood work including glucose to rule out metabolic causes. -Advise to maintain adequate hydration.  Hyperlipidemia Patient stopped taking cholesterol medication due to incorrect dosage (20mg  instead of 10mg ) being dispensed. -Ensure correct dosage (10mg ) of cholesterol medication is sent to [facility] in [place]. -Advise to take cholesterol medication once daily.  Insomnia Patient reports difficulty sleeping, possibly related to work stress. Previous use of sleep medication, but found it too strong. Has tried melatonin with limited success. -Prescribe Ambien for use 3-4 days per week. -Advise continued use of over-the-counter melatonin as needed.          Mila Merry, MD  [facility] [phone] (phone) [phone] (fax)  [facility]

## 2023-10-25 ENCOUNTER — Other Ambulatory Visit: Payer: Self-pay | Admitting: Family Medicine

## 2023-10-25 DIAGNOSIS — E7800 Pure hypercholesterolemia, unspecified: Secondary | ICD-10-CM

## 2023-10-28 NOTE — Telephone Encounter (Signed)
Requested Prescriptions  Pending Prescriptions Disp Refills   rosuvastatin (CRESTOR) 10 MG tablet [Pharmacy Med Name: ROSUVASTATIN CALCIUM 10 MG TAB] 90 tablet 1    Sig: TAKE 1 TABLET BY MOUTH DAILY     Cardiovascular:  Antilipid - Statins 2 Failed - 10/28/2023 11:35 AM      Failed - Lipid Panel in normal range within the last 12 months    Cholesterol, Total  Date Value Ref Range Status  04/12/2023 234 (H) 100 - 199 mg/dL Final   LDL Cholesterol (Calc)  Date Value Ref Range Status  07/30/2017 133 (H) mg/dL (calc) Final    Comment:    Reference range: <100 . Desirable range <100 mg/dL for primary prevention;   <70 mg/dL for patients with CHD or diabetic patients  with > or = 2 CHD risk factors. Marland Kitchen LDL-C is now calculated using the Martin-Hopkins  calculation, which is a validated novel method providing  better accuracy than the Friedewald equation in the  estimation of LDL-C.  Horald Pollen et al. Lenox Ahr. 1610;960(45): 2061-2068  (http://education.[employer].com/faq/FAQ164)    LDL Chol Calc (NIH)  Date Value Ref Range Status  04/12/2023 158 (H) 0 - 99 mg/dL Final   HDL  Date Value Ref Range Status  04/12/2023 63 >39 mg/dL Final   Triglycerides  Date Value Ref Range Status  04/12/2023 78 0 - 149 mg/dL Final         Passed - Cr in normal range and within 360 days    Creat  Date Value Ref Range Status  07/30/2017 0.73 0.50 - 1.10 mg/dL Final   Creatinine, Ser  Date Value Ref Range Status  04/12/2023 0.87 0.57 - 1.00 mg/dL Final         Passed - Patient is not pregnant      Passed - Valid encounter within last 12 months    Recent Outpatient Visits           6 months ago Dizziness   [facility] Malva Limes, MD   1 year ago Annual physical exam   [facility] Malva Limes, MD   2 years ago Vitamin D deficiency   [facility] Malva Limes, MD   2 years ago Pelvic pain     [facility] Malva Limes, MD   4 years ago Neck pain   [facility] Malva Limes, MD

## 2023-10-28 NOTE — Telephone Encounter (Signed)
 Pt needs an ov

## 2023-12-27 ENCOUNTER — Other Ambulatory Visit: Payer: Self-pay | Admitting: Family Medicine

## 2023-12-27 MED ORDER — TRAZODONE HCL 50 MG PO TABS: 25.0000 mg | ORAL_TABLET | Freq: Every evening | ORAL | 2 refills | Status: DC | PRN

## 2023-12-27 NOTE — Progress Notes (Signed)
 Patient's husband Twombly was seen today and states his wife would like to try low dose trazodone  instead of Ambien 

## 2024-02-21 ENCOUNTER — Ambulatory Visit: Admitting: Physician Assistant

## 2024-02-21 VITALS — BP 111/66 | HR 82 | Resp 16 | Ht 60.0 in | Wt 154.0 lb

## 2024-02-21 DIAGNOSIS — R5383 Other fatigue: Secondary | ICD-10-CM | POA: Diagnosis not present

## 2024-02-21 DIAGNOSIS — R109 Unspecified abdominal pain: Secondary | ICD-10-CM

## 2024-02-21 DIAGNOSIS — D219 Benign neoplasm of connective and other soft tissue, unspecified: Secondary | ICD-10-CM | POA: Diagnosis not present

## 2024-02-21 DIAGNOSIS — Z1211 Encounter for screening for malignant neoplasm of colon: Secondary | ICD-10-CM

## 2024-02-21 NOTE — Progress Notes (Unsigned)
 Established patient visit  Patient: Suzanne Sanchez   DOB: 07-02-1971   53 y.o. Female  MRN: [id number] Visit Date: 02/21/2024  Today's healthcare provider: Blane Bunting, PA-C   Chief Complaint  Patient presents with   Pain    Pain lt side for 2weeks . Working in the garden. Pt thinks it could Kidney's or Liver Pt wants an A1C done as well with blood panel   Subjective     HPI     Pain    Additional comments: Pain lt side for 2weeks . Working in the garden. Pt thinks it could Kidney's or Liver Pt wants an A1C done as well with blood panel      Last edited by Estill Hemming, CMA on 02/21/2024 10:41 AM.       Discussed the use of AI scribe software for clinical note transcription with the patient, who gave verbal consent to proceed.  History of Present Illness   Discussed the use of AI scribe software for clinical note transcription with the patient, who gave verbal consent to proceed.  History of Present Illness   Suzanne Sanchez is a 53 year old female who presents with persistent left-sided abdominal pain.  She experiences constant pressure in the left lower quadrant for two weeks, worsened by sitting and relieved by standing or walking. The pain began after gardening and worsened with a 'heavy pull' sensation in the shower. It is currently rated 3/10. She walks two miles daily to alleviate discomfort. No fever, diarrhea, constipation, or changes in bowel movements. She has increased water intake and eliminated coffee, soda, and red meat without significant changes.  She has a history of fibroids and a benign cyst on the right side. Her last menstrual period was in December. No vaginal discharge, urinary changes, or spotting. Increased hot flashes over the past two weeks are noted.  She has not taken regular medications for the pain but used Tylenol initially. No heat therapy has been used. The pain is described as pressure and pulling, similar to childbirth  sensations.  On Saturday, she experienced fatigue, weakness, a severe headache, and light sensitivity, managed with Tylenol. Occasional rapid heartbeats occur during pain episodes. Bowel movements remain regular without blood in stool or urine. She finds standing more comfortable than sitting at work.          04/12/2023   10:29 AM 06/20/2022    9:05 AM 04/21/2021   10:14 AM  Depression screen PHQ 2/9  Decreased Interest 0 0 0  Down, Depressed, Hopeless 0 0 0  PHQ - 2 Score 0 0 0  Altered sleeping  0 0  Tired, decreased energy  1 0  Change in appetite  0 0  Feeling bad or failure about yourself   0 0  Trouble concentrating  0 0  Moving slowly or fidgety/restless  0 0  Suicidal thoughts  0 0  PHQ-9 Score  1 0  Difficult doing work/chores  Not difficult at all Not difficult at all       No data to display          Medications: Outpatient Medications Prior to Visit  Medication Sig   ASPIRIN 81 PO Take by mouth.   rosuvastatin  (CRESTOR ) 10 MG tablet TAKE 1 TABLET BY MOUTH DAILY   traZODone  (DESYREL ) 50 MG tablet Take 0.5-1 tablets (25-50 mg total) by mouth at bedtime as needed for sleep.   zolpidem  (AMBIEN ) 5 MG tablet Take 1 tablet (5 mg total) by mouth at bedtime  as needed for sleep.   No facility-administered medications prior to visit.    Review of Systems All negative Except see HPI       Objective    BP 111/66 (BP Location: Left Arm, Patient Position: Sitting)   Pulse 82   Resp 16   Ht 5' (1.524 m)   Wt 154 lb (69.9 kg)   SpO2 100%   BMI 30.08 kg/m     Physical Exam Abdominal:     General: Abdomen is flat. There is no distension.     Palpations: There is no mass.     Tenderness: There is abdominal tenderness (ligth lower quadrant tenderness on palpation). There is no guarding or rebound.     Hernia: No hernia is present.      No results found for any visits on 02/21/24.      Assessment & Plan      Left Lower Quadrant Pain Persistent left  lower quadrant pain for two weeks, could be musculoskeletal vs gynecological and gastrointestinal causes /diverticulitis.  Negative cologuard, per pt. Denies having fever. Imaging needed for clarification. - Order abdominal and pelvic ultrasound to evaluate for gynecological issues and GI issues. - Perform urinalysis to rule out urinary tract issues. - Order basic lab work including white blood cell count and electrolytes. - Consider referral to OBGYN if imaging suggests gynecological issues.     Left sided abdominal pain (Primary)  - Urinalysis, Routine w reflex microscopic - CBC with Differential/Platelet - Comprehensive metabolic panel with GFR - Hemoglobin A1c - TSH - Lipid panel - US  Abdomen Complete; Future - US  Pelvis Complete; Future - Lipase - Lipase  Fibroids Multiple uterine fibroids, left ovarian cyst were found on US  pelvis on 01/12/21 Pt needs to be rechecked US  of abdomen and pelvis was scheduled Will reassess after results Will follow-up   Other fatigue Chronic Ordered workup - Urinalysis, Routine w reflex microscopic - CBC with Differential/Platelet - Comprehensive metabolic panel with GFR - Hemoglobin A1c - TSH - Lipid panel Will reassess after  receiving lab results Will FU  No orders of the defined types were placed in this encounter.   No follow-ups on file.   The patient was advised to call back or seek an in-person evaluation if the symptoms worsen or if the condition fails to improve as anticipated.  I discussed the assessment and treatment plan with the patient. The patient was provided an opportunity to ask questions and all were answered. The patient agreed with the plan and demonstrated an understanding of the instructions.  I, Darrell Leonhardt, PA-C have reviewed all documentation for this visit. The documentation on 02/21/2024  for the exam, diagnosis, procedures, and orders are all accurate and complete.  Blane Bunting, [facility], MMS [facility] [phone] (phone) [phone] (fax)  [facility]

## 2024-02-22 ENCOUNTER — Encounter: Payer: Self-pay | Admitting: Physician Assistant

## 2024-02-22 ENCOUNTER — Ambulatory Visit: Payer: Self-pay | Admitting: Physician Assistant

## 2024-02-22 DIAGNOSIS — R5383 Other fatigue: Secondary | ICD-10-CM | POA: Insufficient documentation

## 2024-02-22 DIAGNOSIS — R109 Unspecified abdominal pain: Secondary | ICD-10-CM | POA: Insufficient documentation

## 2024-02-22 LAB — COMPREHENSIVE METABOLIC PANEL WITH GFR
ALT: 19 IU/L (ref 0–32)
AST: 23 IU/L (ref 0–40)
Albumin: 4.7 g/dL (ref 3.8–4.9)
Alkaline Phosphatase: 69 IU/L (ref 44–121)
BUN/Creatinine Ratio: 11 (ref 9–23)
BUN: 9 mg/dL (ref 6–24)
Bilirubin Total: 0.4 mg/dL (ref 0.0–1.2)
CO2: 21 mmol/L (ref 20–29)
Calcium: 9.9 mg/dL (ref 8.7–10.2)
Chloride: 105 mmol/L (ref 96–106)
Creatinine, Ser: 0.83 mg/dL (ref 0.57–1.00)
Globulin, Total: 2.3 g/dL (ref 1.5–4.5)
Glucose: 94 mg/dL (ref 70–99)
Potassium: 5 mmol/L (ref 3.5–5.2)
Sodium: 143 mmol/L (ref 134–144)
Total Protein: 7 g/dL (ref 6.0–8.5)
eGFR: 84 mL/min/{1.73_m2} (ref >59)

## 2024-02-22 LAB — URINALYSIS, ROUTINE W REFLEX MICROSCOPIC
Bilirubin, UA: NEGATIVE
Glucose, UA: NEGATIVE
Ketones, UA: NEGATIVE
Leukocytes,UA: NEGATIVE
Nitrite, UA: NEGATIVE
Protein,UA: NEGATIVE
RBC, UA: NEGATIVE
Specific Gravity, UA: 1.007 (ref 1.005–1.030)
Urobilinogen, Ur: 0.2 mg/dL (ref 0.2–1.0)
pH, UA: 7 (ref 5.0–7.5)

## 2024-02-22 LAB — CBC WITH DIFFERENTIAL/PLATELET
Basophils Absolute: 0 10*3/uL (ref 0.0–0.2)
Basos: 1 %
EOS (ABSOLUTE): 0.1 10*3/uL (ref 0.0–0.4)
Eos: 2 %
Hematocrit: 44.8 % (ref 34.0–46.6)
Hemoglobin: 14.7 g/dL (ref 11.1–15.9)
Immature Grans (Abs): 0 10*3/uL (ref 0.0–0.1)
Immature Granulocytes: 0 %
Lymphocytes Absolute: 1.7 10*3/uL (ref 0.7–3.1)
Lymphs: 33 %
MCH: 29.5 pg (ref 26.6–33.0)
MCHC: 32.8 g/dL (ref 31.5–35.7)
MCV: 90 fL (ref 79–97)
Monocytes Absolute: 0.4 10*3/uL (ref 0.1–0.9)
Monocytes: 7 %
Neutrophils Absolute: 3 10*3/uL (ref 1.4–7.0)
Neutrophils: 57 %
Platelets: 363 10*3/uL (ref 150–450)
RBC: 4.99 x10E6/uL (ref 3.77–5.28)
RDW: 12.8 % (ref 11.7–15.4)
WBC: 5.2 10*3/uL (ref 3.4–10.8)

## 2024-02-22 LAB — LIPID PANEL
Chol/HDL Ratio: 2.6 ratio (ref 0.0–4.4)
Cholesterol, Total: 152 mg/dL (ref 100–199)
HDL: 59 mg/dL (ref >39)
LDL Chol Calc (NIH): 83 mg/dL (ref 0–99)
Triglycerides: 45 mg/dL (ref 0–149)
VLDL Cholesterol Cal: 10 mg/dL (ref 5–40)

## 2024-02-22 LAB — LIPASE: Lipase: 52 U/L (ref 14–72)

## 2024-02-22 LAB — HEMOGLOBIN A1C
Est. average glucose Bld gHb Est-mCnc: 114 mg/dL
Hgb A1c MFr Bld: 5.6 % (ref 4.8–5.6)

## 2024-02-22 LAB — TSH: TSH: 1.08 u[IU]/mL (ref 0.450–4.500)

## 2024-03-04 ENCOUNTER — Ambulatory Visit

## 2024-03-04 ENCOUNTER — Ambulatory Visit: Admission: RE | Admit: 2024-03-04 | Source: Ambulatory Visit

## 2024-03-06 ENCOUNTER — Ambulatory Visit: Admitting: Physician Assistant

## 2024-03-23 ENCOUNTER — Encounter: Admitting: Family Medicine

## 2024-04-12 ENCOUNTER — Other Ambulatory Visit: Payer: Self-pay | Admitting: Family Medicine

## 2024-05-07 ENCOUNTER — Other Ambulatory Visit: Payer: Self-pay | Admitting: Family Medicine

## 2024-05-07 DIAGNOSIS — E7800 Pure hypercholesterolemia, unspecified: Secondary | ICD-10-CM
